# Patient Record
Sex: Male | Born: 1965 | Race: White | Hispanic: No | Smoking: Current every day smoker
Health system: Southern US, Community
[De-identification: ages and names within clinical notes are randomized; demographics above are authoritative.]

## PROBLEM LIST (undated history)

## (undated) DIAGNOSIS — E7800 Pure hypercholesterolemia, unspecified: Secondary | ICD-10-CM

## (undated) DIAGNOSIS — F1910 Other psychoactive substance abuse, uncomplicated: Secondary | ICD-10-CM

## (undated) HISTORY — PX: APPENDECTOMY: SHX54

## (undated) HISTORY — DX: Other psychoactive substance abuse, uncomplicated: F19.10

---

## 2003-05-21 ENCOUNTER — Emergency Department (HOSPITAL_COMMUNITY): Admission: AD | Admit: 2003-05-21 | Discharge: 2003-05-21 | Payer: Self-pay | Admitting: Family Medicine

## 2011-10-25 ENCOUNTER — Emergency Department (HOSPITAL_COMMUNITY): Payer: Self-pay | Admitting: Anesthesiology

## 2011-10-25 ENCOUNTER — Emergency Department (HOSPITAL_BASED_OUTPATIENT_CLINIC_OR_DEPARTMENT_OTHER): Payer: Self-pay

## 2011-10-25 ENCOUNTER — Encounter (HOSPITAL_COMMUNITY): Payer: Self-pay | Admitting: Anesthesiology

## 2011-10-25 ENCOUNTER — Inpatient Hospital Stay (HOSPITAL_BASED_OUTPATIENT_CLINIC_OR_DEPARTMENT_OTHER)
Admission: EM | Admit: 2011-10-25 | Discharge: 2011-10-26 | DRG: 343 | Disposition: A | Discharge status: Home / Self Care | Payer: Self-pay | Attending: General Surgery | Admitting: General Surgery

## 2011-10-25 ENCOUNTER — Encounter (HOSPITAL_COMMUNITY)
Admission: EM | Disposition: A | Discharge status: Home / Self Care | Payer: Self-pay | Source: Home / Self Care | Attending: General Surgery

## 2011-10-25 ENCOUNTER — Encounter (HOSPITAL_BASED_OUTPATIENT_CLINIC_OR_DEPARTMENT_OTHER): Payer: Self-pay | Admitting: *Deleted

## 2011-10-25 DIAGNOSIS — E7800 Pure hypercholesterolemia, unspecified: Secondary | ICD-10-CM | POA: Diagnosis present

## 2011-10-25 DIAGNOSIS — K3580 Unspecified acute appendicitis: Principal | ICD-10-CM | POA: Diagnosis present

## 2011-10-25 DIAGNOSIS — K37 Unspecified appendicitis: Secondary | ICD-10-CM

## 2011-10-25 HISTORY — DX: Pure hypercholesterolemia, unspecified: E78.00

## 2011-10-25 HISTORY — PX: LAPAROSCOPIC APPENDECTOMY: SHX408

## 2011-10-25 LAB — DIFFERENTIAL
Eosinophils Absolute: 0.2 10*3/uL (ref 0.0–0.7)
Eosinophils Relative: 2 % (ref 0–5)
Lymphocytes Relative: 11 % — ABNORMAL LOW (ref 12–46)
Lymphs Abs: 1 10*3/uL (ref 0.7–4.0)
Monocytes Absolute: 1 10*3/uL (ref 0.1–1.0)

## 2011-10-25 LAB — COMPREHENSIVE METABOLIC PANEL
CO2: 26 mEq/L (ref 19–32)
Calcium: 8.9 mg/dL (ref 8.4–10.5)
Creatinine, Ser: 1.1 mg/dL (ref 0.50–1.35)
GFR calc Af Amer: >90 mL/min (ref >90)
GFR calc non Af Amer: 79 mL/min — ABNORMAL LOW (ref >90)
Glucose, Bld: 96 mg/dL (ref 70–99)
Sodium: 137 mEq/L (ref 135–145)
Total Protein: 7.6 g/dL (ref 6.0–8.3)

## 2011-10-25 LAB — CBC
HCT: 40.5 % (ref 39.0–52.0)
MCH: 30.8 pg (ref 26.0–34.0)
MCV: 86.5 fL (ref 78.0–100.0)
Platelets: 137 10*3/uL — ABNORMAL LOW (ref 150–400)
RBC: 4.68 MIL/uL (ref 4.22–5.81)
RDW: 13.5 % (ref 11.5–15.5)
WBC: 9.6 10*3/uL (ref 4.0–10.5)

## 2011-10-25 SURGERY — APPENDECTOMY, LAPAROSCOPIC
Anesthesia: General | Site: Abdomen | Wound class: Contaminated

## 2011-10-25 MED ORDER — LACTATED RINGERS IR SOLN
Status: DC | PRN
  Administered 2011-10-25: 1000 mL

## 2011-10-25 MED ORDER — ACETAMINOPHEN 10 MG/ML IV SOLN
INTRAVENOUS | Status: AC
  Filled 2011-10-25: qty 100

## 2011-10-25 MED ORDER — ONDANSETRON HCL 4 MG/2ML IJ SOLN: 4.0000 mg | Freq: Four times a day (QID) | INTRAMUSCULAR | Status: DC | PRN

## 2011-10-25 MED ORDER — BUPIVACAINE HCL (PF) 0.25 % IJ SOLN
INTRAMUSCULAR | Status: AC
  Filled 2011-10-25: qty 30

## 2011-10-25 MED ORDER — IOHEXOL 300 MG/ML  SOLN
20.0000 mL | INTRAMUSCULAR | Status: AC
  Administered 2011-10-25 (×2): 20 mL via ORAL

## 2011-10-25 MED ORDER — LIDOCAINE-EPINEPHRINE 1 %-1:100000 IJ SOLN
INTRAMUSCULAR | Status: DC | PRN
  Administered 2011-10-25: 10 mL

## 2011-10-25 MED ORDER — PROMETHAZINE HCL 25 MG/ML IJ SOLN: 6.2500 mg | INTRAMUSCULAR | Status: DC | PRN

## 2011-10-25 MED ORDER — LACTATED RINGERS IV SOLN: INTRAVENOUS | Status: DC

## 2011-10-25 MED ORDER — IOHEXOL 300 MG/ML  SOLN
100.0000 mL | Freq: Once | INTRAMUSCULAR | Status: AC | PRN
  Administered 2011-10-25: 100 mL via INTRAVENOUS

## 2011-10-25 MED ORDER — KCL IN DEXTROSE-NACL 20-5-0.45 MEQ/L-%-% IV SOLN
INTRAVENOUS | Status: DC
  Administered 2011-10-25: 21:00:00 via INTRAVENOUS
  Filled 2011-10-25 (×2): qty 1000

## 2011-10-25 MED ORDER — FENTANYL CITRATE 0.05 MG/ML IJ SOLN
INTRAMUSCULAR | Status: DC | PRN
  Administered 2011-10-25 (×2): 50 ug via INTRAVENOUS
  Administered 2011-10-25: 150 ug via INTRAVENOUS
  Administered 2011-10-25: 50 ug via INTRAVENOUS

## 2011-10-25 MED ORDER — LIDOCAINE-EPINEPHRINE 1 %-1:100000 IJ SOLN
INTRAMUSCULAR | Status: AC
  Filled 2011-10-25: qty 1

## 2011-10-25 MED ORDER — SODIUM CHLORIDE 0.9 % IV SOLN
1.0000 g | INTRAVENOUS | Status: AC
  Administered 2011-10-25: 1 g via INTRAVENOUS
  Filled 2011-10-25: qty 1

## 2011-10-25 MED ORDER — HYDROCODONE-ACETAMINOPHEN 5-325 MG PO TABS: 1.0000 | ORAL_TABLET | ORAL | Status: DC | PRN

## 2011-10-25 MED ORDER — LIDOCAINE HCL (CARDIAC) 20 MG/ML IV SOLN
INTRAVENOUS | Status: DC | PRN
  Administered 2011-10-25: 40 mg via INTRAVENOUS

## 2011-10-25 MED ORDER — FENTANYL CITRATE 0.05 MG/ML IJ SOLN: 25.0000 ug | INTRAMUSCULAR | Status: DC | PRN

## 2011-10-25 MED ORDER — ONDANSETRON HCL 4 MG PO TABS: 4.0000 mg | ORAL_TABLET | Freq: Four times a day (QID) | ORAL | Status: DC | PRN

## 2011-10-25 MED ORDER — GLYCOPYRROLATE 0.2 MG/ML IJ SOLN
INTRAMUSCULAR | Status: DC | PRN
  Administered 2011-10-25: .6 mg via INTRAVENOUS

## 2011-10-25 MED ORDER — CISATRACURIUM BESYLATE (PF) 10 MG/5ML IV SOLN
INTRAVENOUS | Status: DC | PRN
  Administered 2011-10-25: 8 mg via INTRAVENOUS
  Administered 2011-10-25: 2 mg via INTRAVENOUS

## 2011-10-25 MED ORDER — KETOROLAC TROMETHAMINE 30 MG/ML IJ SOLN: 15.0000 mg | Freq: Once | INTRAMUSCULAR | Status: DC | PRN

## 2011-10-25 MED ORDER — NEOSTIGMINE METHYLSULFATE 1 MG/ML IJ SOLN
INTRAMUSCULAR | Status: DC | PRN
  Administered 2011-10-25: 4 mg via INTRAVENOUS

## 2011-10-25 MED ORDER — LACTATED RINGERS IV SOLN
INTRAVENOUS | Status: DC | PRN
  Administered 2011-10-25 (×2): via INTRAVENOUS

## 2011-10-25 MED ORDER — PROPOFOL 10 MG/ML IV EMUL
INTRAVENOUS | Status: DC | PRN
  Administered 2011-10-25: 180 mg via INTRAVENOUS
  Administered 2011-10-25: 20 mg via INTRAVENOUS

## 2011-10-25 MED ORDER — DEXTROSE 5 % IV SOLN
1.0000 g | Freq: Four times a day (QID) | INTRAVENOUS | Status: AC
  Administered 2011-10-25 – 2011-10-26 (×3): 1 g via INTRAVENOUS
  Filled 2011-10-25 (×4): qty 1

## 2011-10-25 MED ORDER — FENTANYL CITRATE 0.05 MG/ML IJ SOLN
INTRAMUSCULAR | Status: AC
  Filled 2011-10-25: qty 2

## 2011-10-25 MED ORDER — DEXAMETHASONE SODIUM PHOSPHATE 4 MG/ML IJ SOLN
INTRAMUSCULAR | Status: DC | PRN
  Administered 2011-10-25: 8 mg via INTRAVENOUS

## 2011-10-25 MED ORDER — BUPIVACAINE HCL 0.25 % IJ SOLN
INTRAMUSCULAR | Status: DC | PRN
  Administered 2011-10-25: 10 mL

## 2011-10-25 MED ORDER — SUCCINYLCHOLINE CHLORIDE 20 MG/ML IJ SOLN
INTRAMUSCULAR | Status: DC | PRN
  Administered 2011-10-25: 80 mg via INTRAVENOUS

## 2011-10-25 MED ORDER — 0.9 % SODIUM CHLORIDE (POUR BTL) OPTIME
TOPICAL | Status: DC | PRN
  Administered 2011-10-25: 1000 mL

## 2011-10-25 MED ORDER — ONDANSETRON HCL 4 MG/2ML IJ SOLN
INTRAMUSCULAR | Status: DC | PRN
  Administered 2011-10-25: 4 mg via INTRAVENOUS

## 2011-10-25 MED ORDER — ACETAMINOPHEN 10 MG/ML IV SOLN
INTRAVENOUS | Status: DC | PRN
  Administered 2011-10-25: 1000 mg via INTRAVENOUS

## 2011-10-25 MED ORDER — ENOXAPARIN SODIUM 40 MG/0.4ML ~~LOC~~ SOLN
40.0000 mg | SUBCUTANEOUS | Status: DC
  Filled 2011-10-25: qty 0.4

## 2011-10-25 MED ORDER — MORPHINE SULFATE 2 MG/ML IJ SOLN: 2.0000 mg | INTRAMUSCULAR | Status: DC | PRN

## 2011-10-25 SURGICAL SUPPLY — 41 items
CABLE HIGH FREQUENCY MONO STRZ (ELECTRODE) ×2 IMPLANT
CANISTER SUCTION 2500CC (MISCELLANEOUS) ×2 IMPLANT
CHLORAPREP W/TINT 26ML (MISCELLANEOUS) ×2 IMPLANT
CLOTH BEACON ORANGE TIMEOUT ST (SAFETY) ×2 IMPLANT
COVER SURGICAL LIGHT HANDLE (MISCELLANEOUS) ×2 IMPLANT
DECANTER SPIKE VIAL GLASS SM (MISCELLANEOUS) ×2 IMPLANT
DERMABOND ADVANCED (GAUZE/BANDAGES/DRESSINGS) ×1
DERMABOND ADVANCED .7 DNX12 (GAUZE/BANDAGES/DRESSINGS) ×1 IMPLANT
DRAPE LAPAROSCOPIC ABDOMINAL (DRAPES) ×2 IMPLANT
ELECT CAUTERY BLADE 6.4 (BLADE) ×2 IMPLANT
ELECT REM PT RETURN 9FT ADLT (ELECTROSURGICAL) ×2
ELECTRODE REM PT RTRN 9FT ADLT (ELECTROSURGICAL) ×1 IMPLANT
ENDO GIA UNIVERSAL XLG (ENDOMECHANICALS) ×2 IMPLANT
FILTER SMOKE EVAC LAPAROSHD (FILTER) IMPLANT
GLOVE BIOGEL PI IND STRL 7.0 (GLOVE) ×1 IMPLANT
GLOVE BIOGEL PI INDICATOR 7.0 (GLOVE) ×1
GLOVE SURG SS PI 7.5 STRL IVOR (GLOVE) ×8 IMPLANT
GOWN PREVENTION PLUS LG XLONG (DISPOSABLE) ×2 IMPLANT
GOWN STRL NON-REIN LRG LVL3 (GOWN DISPOSABLE) ×2 IMPLANT
GOWN STRL REIN XL XLG (GOWN DISPOSABLE) ×4 IMPLANT
GRASPER LAPSCPC 5X35 EPIX (ENDOMECHANICALS) IMPLANT
HANDLE STAPLE  ENDO EGIA 4 STD (STAPLE) ×1
HANDLE STAPLE ENDO EGIA 4 STD (STAPLE) ×1 IMPLANT
KIT BASIN OR (CUSTOM PROCEDURE TRAY) ×2 IMPLANT
NS IRRIG 1000ML POUR BTL (IV SOLUTION) ×2 IMPLANT
PENCIL BUTTON HOLSTER BLD 10FT (ELECTRODE) ×2 IMPLANT
POUCH SPECIMEN RETRIEVAL 10MM (ENDOMECHANICALS) ×2 IMPLANT
RELOAD EGIA 45 MED/THCK PURPLE (STAPLE) ×2 IMPLANT
RELOAD EGIA 45 TAN VASC (STAPLE) IMPLANT
SCALPEL HARMONIC ACE (MISCELLANEOUS) IMPLANT
SCISSORS LAP 5X35 DISP (ENDOMECHANICALS) ×2 IMPLANT
SET IRRIG TUBING LAPAROSCOPIC (IRRIGATION / IRRIGATOR) ×2 IMPLANT
SLEEVE Z-THREAD 5X100MM (TROCAR) ×2 IMPLANT
SOLUTION ANTI FOG 6CC (MISCELLANEOUS) ×2 IMPLANT
SUT MNCRL AB 4-0 PS2 18 (SUTURE) ×2 IMPLANT
TOWEL OR 17X26 10 PK STRL BLUE (TOWEL DISPOSABLE) ×2 IMPLANT
TRAY FOLEY CATH 14FRSI W/METER (CATHETERS) ×2 IMPLANT
TRAY LAP CHOLE (CUSTOM PROCEDURE TRAY) ×2 IMPLANT
TROCAR BALLN 12MMX100 BLUNT (TROCAR) ×2 IMPLANT
TROCAR Z-THREAD FIOS 5X100MM (TROCAR) IMPLANT
TUBING INSUFFLATION 10FT LAP (TUBING) ×2 IMPLANT

## 2011-10-25 NOTE — Op Note (Signed)
NAME:  Garrett Knight, Garrett Knight NO.:  [id number]  MEDICAL RECORD NO.:  [id number]  LOCATION:  [facility]                         FACILITY:  [facility]  PHYSICIAN:  Lodema Pilot, MD       DATE OF BIRTH:  1965/12/27  DATE OF PROCEDURE:  10/25/2011 DATE OF DISCHARGE:                              OPERATIVE REPORT   PROCEDURE:  Laparoscopic appendectomy.  PREOPERATIVE DIAGNOSIS:  Acute appendicitis.  POSTOPERATIVE DIAGNOSIS:  Acute appendicitis.  SURGEON:  Dr. Biagio Quint.  ASSISTANTS:  None.  ANESTHESIA:  General endotracheal anesthesia with 20 mL of 1% lidocaine with epinephrine and 0.25% Marcaine in a 50/50 mixture.  FLUIDS:  1200 mL crystalloid.  ESTIMATED BLOOD LOSS:  Minimal.  DRAINS:  None.  SPECIMENS:  Appendix sent to Pathology for permanent section.  COMPLICATIONS:  None apparent.  FINDINGS:  Acute suppurative appendicitis.  INDICATIONS FOR PROCEDURE:  Garrett Knight is a 46 year old male with three day history of right lower quadrant pain, anorexia and nausea, and a CT scan concerning for acute appendicitis.  PROCEDURE IN DETAIL:  Garrett Knight was seen and evaluated in the emergency room, and the risks and benefits of the procedure were discussed in lay terms, and an  informed consent was obtained.  I discussed with him the risks of infection, bleeding, pain, scarring, persistent symptoms, negative laparoscopy, abscess formation and the need for repeat surgery.  He expressed understanding and an informed consent was obtained.  He was given therapeutic antibiotics and taken to the operating room, and placed on the table in the supine position. General endotracheal anesthesia was obtained.  His abdomen was prepped and draped in the standard surgical fashion, and a semicircular infraumbilical incision was made in the skin and dissection carried down to the abdominal wall fascia.  Using blunt dissection, the fascia was elevated and sharply incised, and the peritoneum  entered bluntly. Pneumoperitoneum was obtained and laparoscope was introduced.  A 5 mm left lower quadrant trocar was placed under direct visualization and a 5 mm upper midline trocar was placed, also under direct visualization.  He had an edematous mesentery of the terminal ileum and some fibrinous exudate in the right lower quadrant, along the pelvic sidewall near the iliac vessels.  The small bowel was adherent to this area and covering up the inflammatory process.  The small bowel was retracted away from the area and initially the appendix was not easily visualized, but I followed the tinea past the cecum and was able to elevate the appendix, which was diving down posterior.  The appendix was very thickened and necrotic-appearing distal appendix.  I dissected out the base of the appendix and divided the mesoappendix with the Harmonic scalpel.  I skeletonized the appendix at its base, which appeared to be healthy for stapling and after I had skeletonized this, and isolated it at its base, I transected this with the Endo-GIA purple Tri-Staple load.  The appendix was removed in an EndoCatch bag in two pieces and was inspected on the back table to ensure that I had obtained the entire appendix. The staples appeared well-formed and the right lower quadrant was suctioned from any of the bowel fluid and a small amount of irrigation was  used locally in the right lower quadrant.  Of note, he also appeared to have a right inguinal region, but no evidence of bowel contents.  The specimen was removed from the umbilical trocar site, then the left lower quadrant trocar was removed under direct visualization.  The abdominal wall was noted be hemostatic.  The right lower quadrant appeared hemostatic.  Then the umbilical fascia was approximated with interrupted 0 Vicryl sutures in an open fashion.  The sutures were secured and the wounds were injected with 20 mL of 1% lidocaine with epinephrine,  and the abdomen was re-insufflated through the upper midline port and the abdominal wall closure was noted be adequate.  The right lower quadrant appeared hemostatic and the trocar was removed, and the incisions were closed with 4-0 Monocryl subcuticular suture.  The skin was washed and dried, and Dermabond was applied.  All sponge and instrument counts were correct at the end of the case.  The patient tolerated the procedure well, without apparent complications.          ______________________________ Lodema Pilot, MD     BL/MEDQ  D:  10/25/2011  T:  10/25/2011  Job:  [id number]

## 2011-10-25 NOTE — ED Notes (Signed)
Patient states his abd atarted hurting Friday after he hurts his back at work, no diarrhea, no nausea, just constant mid abd, severe pain, ache/stabbing, has to lean over to walk. Took ibuprofen last night some relief

## 2011-10-25 NOTE — Anesthesia Postprocedure Evaluation (Signed)
  Anesthesia Post-op Note  Patient: Garrett Knight  Procedure(s) Performed: Procedure(s) (LRB): APPENDECTOMY LAPAROSCOPIC (N/A)  Patient Location: PACU  Anesthesia Type: General  Level of Consciousness: awake and alert   Airway and Oxygen Therapy: Patient Spontanous Breathing  Post-op Pain: mild  Post-op Assessment: Post-op Vital signs reviewed, Patient's Cardiovascular Status Stable, Respiratory Function Stable, Patent Airway and No signs of Nausea or vomiting  Post-op Vital Signs: stable  Complications: No apparent anesthesia complications

## 2011-10-25 NOTE — Progress Notes (Signed)
Arrived to floor from pacu. Alert and oriented no c/o pain. Incisions x3 cd&i.

## 2011-10-25 NOTE — ED Notes (Signed)
MD at bedside. 

## 2011-10-25 NOTE — ED Notes (Signed)
WUJ:[id number]<JY> Expected date:<BR> Expected time:<BR> Means of arrival:<BR> Comments:<BR> Trans [facility] Appendicitis

## 2011-10-25 NOTE — ED Provider Notes (Signed)
History     CSN: [id number]  Arrival date & time 10/25/11  1027   First MD Initiated Contact with Patient 10/25/11 1107      Chief Complaint  Patient presents with  . Abdominal Pain    (Consider location/radiation/quality/duration/timing/severity/associated sxs/prior treatment) Patient is a 46 y.o. male presenting with abdominal pain. The history is provided by the patient.  Abdominal Pain The primary symptoms of the illness include abdominal pain. The primary symptoms of the illness do not include dysuria. Episode onset: 3 days ago. The onset of the illness was sudden. The problem has not changed since onset. The abdominal pain is located in the periumbilical region. The abdominal pain does not radiate. The severity of the abdominal pain is 9/10. The abdominal pain is relieved by nothing. The abdominal pain is exacerbated by movement (Standing and walking).  Associated with: Started after hurting his back at work. The patient has not had a change in bowel habit. Additional symptoms associated with the illness include back pain. Symptoms associated with the illness do not include chills, anorexia, diaphoresis, constipation, urgency or frequency. Significant associated medical issues do not include GERD, gallstones or liver disease.    Past Medical History  Diagnosis Date  . High cholesterol     History reviewed. No pertinent past surgical history.  No family history on file.  History  Substance Use Topics  . Smoking status: Current Everyday Smoker  . Smokeless tobacco: Not on file  . Alcohol Use: No      Review of Systems  Constitutional: Negative for chills and diaphoresis.  Gastrointestinal: Positive for abdominal pain. Negative for constipation and anorexia.  Genitourinary: Negative for dysuria, urgency, frequency and flank pain.  Musculoskeletal: Positive for back pain.       States he has intermittant low back problems all the time with his work.  Last started  hurting on Thursday of this week.  All other systems reviewed and are negative.    Allergies  Review of patient's allergies indicates no known allergies.  Home Medications  No current outpatient prescriptions on file.  BP 101/61  Pulse 76  Temp(Src) 98 F (36.7 C) (Oral)  Resp 20  SpO2 99%  Physical Exam  Nursing note and vitals reviewed. Constitutional: He is oriented to person, place, and time. He appears well-developed and well-nourished. No distress.  HENT:  Head: Normocephalic and atraumatic.  Mouth/Throat: Oropharynx is clear and moist.  Eyes: Conjunctivae and EOM are normal. Pupils are equal, round, and reactive to light.  Neck: Normal range of motion. Neck supple.  Cardiovascular: Normal rate, regular rhythm and intact distal pulses.   No murmur heard. Pulmonary/Chest: Effort normal and breath sounds normal. No respiratory distress. He has no wheezes. He has no rales.  Abdominal: Soft. Normal appearance. He exhibits no distension and no pulsatile midline mass. There is tenderness in the right lower quadrant and periumbilical area. There is guarding. There is no rebound and no CVA tenderness. No hernia.  Musculoskeletal: Normal range of motion. He exhibits no edema and no tenderness.  Neurological: He is alert and oriented to person, place, and time.  Skin: Skin is warm and dry. No rash noted. No erythema.  Psychiatric: He has a normal mood and affect. His behavior is normal.    ED Course  Procedures (including critical care time)  Labs Reviewed  CBC - Abnormal; Notable for the following:    Platelets 137 (*)    All other components within normal limits  DIFFERENTIAL -  Abnormal; Notable for the following:    Lymphocytes Relative 11 (*)    All other components within normal limits  COMPREHENSIVE METABOLIC PANEL - Abnormal; Notable for the following:    BUN 28 (*)    Albumin 3.3 (*)    GFR calc non Af Amer 79 (*)    All other components within normal limits    Ct Abdomen Pelvis W Contrast  10/25/2011  *RADIOLOGY REPORT*  Clinical Data: Abdominal pain around the umbilicus.  Heavy lifting on Thursday.  Back pain.  No nausea or vomiting.  CT ABDOMEN AND PELVIS WITH CONTRAST  Technique:  Multidetector CT imaging of the abdomen and pelvis was performed following the standard protocol during bolus administration of intravenous contrast.  Contrast: OMNIPAQUE IOHEXOL 300 MG/ML  SOLN  Comparison: None.  Findings:  The lung bases are unremarkable.  Heart size is normal. Small low attenuation lesions are identified within the liver, some of which show enhanced pattern on the delayed images. Statistically, these likely represent small hemangiomas.  However, these are not fully characterized on this exam.  No focal abnormality identified within the spleen, pancreas, adrenal glands, or kidneys.  The gallbladder is present.  Within the right lower quadrant, there is inflammatory change surrounding the appendix and mesentery of the lower ileum.  The appendix is 13 mm in diameter and the inflammatory changes appears centered on the appendix.  There are inflammatory changes and enhancement of the appendiceal wall and terminal ileal wall. Findings are favored to be acute appendicitis secondarily involving the distal ileal loops.  Alternatively, the findings could be related to current.  No evidence for abscess, free intraperitoneal air.  The colon has a normal appearance.  Stomach is normal in appearance.  No evidence for aortic aneurysm.  There is atherosclerotic calcification of the aorta. There are mildly prominent lymph nodes within the external iliac chains bilaterally, on the right measuring 2 cm and on the left, measuring 2.4 cm.  Visualized osseous structures have a normal appearance.  IMPRESSION:  1.  Significant inflammatory changes in the right lower quadrant, favored to be related to acute appendicitis. 2.  Inflammation, wall thickening, and because of enhancement of  the right lower quadrant ileal loops, favored to be a secondary process related to appendicitis.  Crohn's disease would be a consideration as well. 3.  No evidence for abscess or free intraperitoneal air. 4.  Multiple small liver lesions, most likely benign.  The findings were discussed with Dr. Anitra Lauth on 10/25/2011 at 1:05 p.m.  Original Report Authenticated By: Patterson Hammersmith, M.D.     No diagnosis found.    MDM   Patient with abdominal pain has been ongoing for the last 3 days. It is worse with movement, standing or walking. He states it occurred after hurting his lower back at work. He denies any nausea, vomiting, change in bowel movements. Pain is worse with eating. He denies any numbness or tingling in his lower extremities and no shortness of breath or chest pain. Epigastric abdominal pain over the umbilicus. There are no palpable hernias. Patient states this has occurred once or twice before in the last 5-6 years. But states usually the pain goes away after a few days. Concern for possible AAA versus abdominal pathology versus hernia.   CBC, CMP are within normal limits. CT of the abdomen and pelvis pending.  1:12 PM CT showed acute appendicitis. Spoke with surgery for admission and removal        [employer],  MD 10/25/11 1312

## 2011-10-25 NOTE — H&P (Signed)
Reason for Consult: appendicitis Referring Physician: [name] is an 46 y.o. male.  HPI: this patient was referred by Dr. Anitra Knight at [facility] for evaluation of three-day history of periumbilical and right lower quadrant pain. He says that he had a similar episode approximately one month ago but this spontaneously resolved after 3 days. He had not had any recurrent episodes until approximately 3 days ago he began feeling nauseated but had no associated vomiting. He went to work and continue to feel bad with some generalized periumbilical pain. He has been anorexic and had associated chills. Now he says that he has just continual right lower quadrant pain which he does describes as severe and constant. He is not measured any fevers but has had associated chills. He denies any dysuria, hematuria, back pain, melena or bloody stools, diarrhea.  Past Medical History  Diagnosis Date  . High cholesterol     History reviewed. No pertinent past surgical history.  No family history on file.  Social History:  reports that he has been smoking.  He does not have any smokeless tobacco history on file. He reports that he does not drink alcohol or use illicit drugs.  Allergies: No Known Allergies  Medications: I have reviewed the patient's current medications.  Results for orders placed during the hospital encounter of 10/25/11 (from the past 48 hour(s))  CBC     Status: Abnormal   Collection Time   10/25/11 11:29 AM      Component Value Range Comment   WBC 9.6  4.0 - 10.5 (K/uL)    RBC 4.68  4.22 - 5.81 (MIL/uL)    Hemoglobin 14.4  13.0 - 17.0 (g/dL)    HCT 16.1  09.6 - 04.5 (%)    MCV 86.5  78.0 - 100.0 (fL)    MCH 30.8  26.0 - 34.0 (pg)    MCHC 35.6  30.0 - 36.0 (g/dL)    RDW 40.9  81.1 - 91.4 (%)    Platelets 137 (*) 150 - 400 (K/uL)   DIFFERENTIAL     Status: Abnormal   Collection Time   10/25/11 11:29 AM      Component Value Range Comment   Neutrophils Relative 76  43 - 77  (%)    Neutro Abs 7.3  1.7 - 7.7 (K/uL)    Lymphocytes Relative 11 (*) 12 - 46 (%)    Lymphs Abs 1.0  0.7 - 4.0 (K/uL)    Monocytes Relative 10  3 - 12 (%)    Monocytes Absolute 1.0  0.1 - 1.0 (K/uL)    Eosinophils Relative 2  0 - 5 (%)    Eosinophils Absolute 0.2  0.0 - 0.7 (K/uL)    Basophils Relative 0  0 - 1 (%)    Basophils Absolute 0.0  0.0 - 0.1 (K/uL)   COMPREHENSIVE METABOLIC PANEL     Status: Abnormal   Collection Time   10/25/11 11:29 AM      Component Value Range Comment   Sodium 137  135 - 145 (mEq/L)    Potassium 3.7  3.5 - 5.1 (mEq/L)    Chloride 103  96 - 112 (mEq/L)    CO2 26  19 - 32 (mEq/L)    Glucose, Bld 96  70 - 99 (mg/dL)    BUN 28 (*) 6 - 23 (mg/dL)    Creatinine, Ser 7.82  0.50 - 1.35 (mg/dL)    Calcium 8.9  8.4 - 10.5 (mg/dL)    Total  Protein 7.6  6.0 - 8.3 (g/dL)    Albumin 3.3 (*) 3.5 - 5.2 (g/dL)    AST 16  0 - 37 (U/L)    ALT 17  0 - 53 (U/L)    Alkaline Phosphatase 64  39 - 117 (U/L)    Total Bilirubin 0.6  0.3 - 1.2 (mg/dL)    GFR calc non Af Amer 79 (*) >90 (mL/min)    GFR calc Af Amer >90  >90 (mL/min)     Ct Abdomen Pelvis W Contrast  10/25/2011  *RADIOLOGY REPORT*  Clinical Data: Abdominal pain around the umbilicus.  Heavy lifting on Thursday.  Back pain.  No nausea or vomiting.  CT ABDOMEN AND PELVIS WITH CONTRAST  Technique:  Multidetector CT imaging of the abdomen and pelvis was performed following the standard protocol during bolus administration of intravenous contrast.  Contrast: OMNIPAQUE IOHEXOL 300 MG/ML  SOLN  Comparison: None.  Findings:  The lung bases are unremarkable.  Heart size is normal. Small low attenuation lesions are identified within the liver, some of which show enhanced pattern on the delayed images. Statistically, these likely represent small hemangiomas.  However, these are not fully characterized on this exam.  No focal abnormality identified within the spleen, pancreas, adrenal glands, or kidneys.  The gallbladder  is present.  Within the right lower quadrant, there is inflammatory change surrounding the appendix and mesentery of the lower ileum.  The appendix is 13 mm in diameter and the inflammatory changes appears centered on the appendix.  There are inflammatory changes and enhancement of the appendiceal wall and terminal ileal wall. Findings are favored to be acute appendicitis secondarily involving the distal ileal loops.  Alternatively, the findings could be related to current.  No evidence for abscess, free intraperitoneal air.  The colon has a normal appearance.  Stomach is normal in appearance.  No evidence for aortic aneurysm.  There is atherosclerotic calcification of the aorta. There are mildly prominent lymph nodes within the external iliac chains bilaterally, on the right measuring 2 cm and on the left, measuring 2.4 cm.  Visualized osseous structures have a normal appearance.  IMPRESSION:  1.  Significant inflammatory changes in the right lower quadrant, favored to be related to acute appendicitis. 2.  Inflammation, wall thickening, and because of enhancement of the right lower quadrant ileal loops, favored to be a secondary process related to appendicitis.  Crohn's disease would be a consideration as well. 3.  No evidence for abscess or free intraperitoneal air. 4.  Multiple small liver lesions, most likely benign.  The findings were discussed with Dr. Anitra Knight on 10/25/2011 at 1:05 p.m.  Original Report Authenticated By: Patterson Hammersmith, M.D.   All other review of systems negative or noncontributory except as stated in the HPI  Blood pressure 104/54, pulse 78, temperature 100.3 F (37.9 C), temperature source Oral, resp. rate 20, SpO2 96.00%. General appearance: alert, cooperative and no distress Head: Normocephalic, without obvious abnormality, atraumatic Resp: clear to auscultation bilaterally Cardio: normal rate, regular rhythm GI: lower abdominal pain which is worst in RLQ, ND, no peritoneal  signs, no mass Extremities: extremities normal, atraumatic, no cyanosis or edema Skin: Skin color, texture, turgor normal. No rashes or lesions Neurologic: Grossly normal  Assessment/Plan: Abdominal pain which is most likely consistent with acute appendicitis.  I think that his symptoms are most likely due to appendicitis. Even though he says that he had a similar episode approximately one month ago given the fact that he  has had continual and increasing right lower quadrant abdominal pain with associated anorexia and chills as well as a CT scan which is concerning for right lower quadrant inflammatory changes, I think that this is most likely appendicitis. He does not have a history consistent with Crohn's disease or inflammatory bowel disease though I did discuss this possibility with the patient. We discussed the options for continued observation versus surgery and the risks and benefits of each option. He would like to proceed with diagnostic laparoscopy and appendectomy. We discussed the risks of the procedure including, infection, bleeding, pain, scarring, persistent symptoms and negative laparoscopy, need for open surgery, abscess and he expressed understanding and desires to proceed with arthroscopic appendectomy possible open.  Garrett Knight 10/25/2011, 4:44 PM

## 2011-10-25 NOTE — Brief Op Note (Signed)
10/25/2011  6:53 PM  PATIENT:  Garrett Knight  46 y.o. male  PRE-OPERATIVE DIAGNOSIS:  appendicitis  POST-OPERATIVE DIAGNOSIS:  appendicitis  PROCEDURE:  Procedure(s) (LRB): APPENDECTOMY LAPAROSCOPIC (N/A)  SURGEON:  Surgeon(s) and Role:    * Lodema Pilot, DO - Primary  PHYSICIAN ASSISTANT:   ASSISTANTS: none   ANESTHESIA:   general  EBL:  Total I/O In: 1000 [I.V.:1000] Out: 120 [Urine:120]  BLOOD ADMINISTERED:none  DRAINS: none   LOCAL MEDICATIONS USED:  MARCAINE    and LIDOCAINE   SPECIMEN:  Source of Specimen:  appendix  DISPOSITION OF SPECIMEN:  PATHOLOGY  COUNTS:  YES  TOURNIQUET:  * No tourniquets in log *  DICTATION: .Other Dictation: Dictation Number [id number]  PLAN OF CARE: Admit for overnight observation  PATIENT DISPOSITION:  PACU - hemodynamically stable.   Delay start of Pharmacological VTE agent (>24hrs) due to surgical blood loss or risk of bleeding: no

## 2011-10-25 NOTE — Anesthesia Preprocedure Evaluation (Signed)
Anesthesia Evaluation  Patient identified by MRN, date of birth, ID band Patient awake    Reviewed: Allergy & Precautions, H&P , NPO status , Patient's Chart, lab work & pertinent test results  Airway Mallampati: II TM Distance: >3 FB Neck ROM: Full    Dental No notable dental hx.    Pulmonary Current Smoker,  breath sounds clear to auscultation  Pulmonary exam normal       Cardiovascular negative cardio ROS  Rhythm:Regular Rate:Normal     Neuro/Psych negative neurological ROS  negative psych ROS   GI/Hepatic negative GI ROS, Neg liver ROS,   Endo/Other  negative endocrine ROS  Renal/GU negative Renal ROS  negative genitourinary   Musculoskeletal negative musculoskeletal ROS (+)   Abdominal   Peds negative pediatric ROS (+)  Hematology negative hematology ROS (+)   Anesthesia Other Findings   Reproductive/Obstetrics negative OB ROS                           Anesthesia Physical Anesthesia Plan  ASA: II and Emergent  Anesthesia Plan: General   Post-op Pain Management:    Induction: Intravenous and Rapid sequence  Airway Management Planned: Oral ETT  Additional Equipment:   Intra-op Plan:   Post-operative Plan: Extubation in OR  Informed Consent: I have reviewed the patients History and Physical, chart, labs and discussed the procedure including the risks, benefits and alternatives for the proposed anesthesia with the patient or authorized representative who has indicated his/her understanding and acceptance.   Dental advisory given  Plan Discussed with: CRNA  Anesthesia Plan Comments:        Anesthesia Quick Evaluation  

## 2011-10-25 NOTE — Preoperative (Addendum)
Beta Blockers   Reason not to administer Beta Blockers:Not Applicable 

## 2011-10-25 NOTE — Transfer of Care (Signed)
Immediate Anesthesia Transfer of Care Note  Patient: Garrett Knight  Procedure(s) Performed: Procedure(s) (LRB): APPENDECTOMY LAPAROSCOPIC (N/A)  Patient Location: PACU  Anesthesia Type: General  Level of Consciousness: awake, alert , oriented and patient cooperative  Airway & Oxygen Therapy: Patient Spontanous Breathing and Patient connected to face mask oxygen  Post-op Assessment: Report given to PACU RN and Post -op Vital signs reviewed and stable  Post vital signs: Reviewed and stable  Complications: No apparent anesthesia complications

## 2011-10-25 NOTE — Anesthesia Procedure Notes (Signed)
Procedure Name: Intubation Date/Time: 10/25/2011 5:36 PM Performed by: Leroy Libman L Patient Re-evaluated:Patient Re-evaluated prior to inductionOxygen Delivery Method: Circle system utilized Preoxygenation: Pre-oxygenation with 100% oxygen Intubation Type: IV induction, Rapid sequence and Cricoid Pressure applied Grade View: Grade II Tube type: Oral Tube size: 8.0 mm Number of attempts: 1 Airway Equipment and Method: Stylet Placement Confirmation: ETT inserted through vocal cords under direct vision,  breath sounds checked- equal and bilateral and positive ETCO2 Secured at: 22 cm Tube secured with: Tape Dental Injury: Teeth and Oropharynx as per pre-operative assessment

## 2011-10-25 NOTE — ED Notes (Signed)
IV covered with dressing, site wnl, instructions given to patient, IV left in place per MD request

## 2011-10-26 ENCOUNTER — Encounter (HOSPITAL_COMMUNITY): Payer: Self-pay | Admitting: *Deleted

## 2011-10-26 MED ORDER — HYDROCODONE-ACETAMINOPHEN 5-325 MG PO TABS: 1.0000 | ORAL_TABLET | ORAL | Status: AC | PRN

## 2011-10-26 NOTE — Discharge Summary (Signed)
  Physician Discharge Summary  Patient ID: Garrett Knight MRN: [id number] DOB/AGE: [birth year] 46 y.o.  Admit date: 10/25/2011 Discharge date: 10/26/2011  Admission Diagnoses: appendicitis  Discharge Diagnoses: same Active Problems:  * No active hospital problems. *    Discharged Condition: stable  Hospital Course: to or 07/29/11 for lap appendectomy.  Did well overnight and pain much improved from preop pain.  Tolerating regular diet, ambulatory and stable for discharge on POD 1  Consults: None  Significant Diagnostic Studies: CT  Treatments: surgery: lap appendectomy 10/26/11  Disposition: Final discharge disposition not confirmed   Medication List  As of 10/26/2011  9:26 AM   ASK your doctor about these medications         sulfamethoxazole-trimethoprim 400-80 MG per tablet   Commonly known as: BACTRIM,SEPTRA   Take 1 tablet by mouth daily.             SignedLodema Pilot DAVID 10/26/2011, 9:26 AM

## 2011-10-26 NOTE — Progress Notes (Signed)
D/c instructions reviewed, prescription given, pt d/c'd, wheelchair assistance offered, pt refused and insisted on walking out to car.  Family with pt upon d/c.

## 2011-10-27 NOTE — Progress Notes (Signed)
UR complete 

## 2011-10-28 ENCOUNTER — Encounter (HOSPITAL_COMMUNITY): Payer: Self-pay | Admitting: General Surgery

## 2011-11-05 ENCOUNTER — Telehealth (INDEPENDENT_AMBULATORY_CARE_PROVIDER_SITE_OTHER): Payer: Self-pay

## 2011-11-05 NOTE — Telephone Encounter (Signed)
Left voice message for patient follow up appointment date & time w/Dr. Biagio Quint on 11/26/11 @ 2:15pm

## 2011-11-26 ENCOUNTER — Encounter (INDEPENDENT_AMBULATORY_CARE_PROVIDER_SITE_OTHER): Payer: Self-pay | Admitting: General Surgery

## 2011-12-02 ENCOUNTER — Encounter (INDEPENDENT_AMBULATORY_CARE_PROVIDER_SITE_OTHER): Payer: Self-pay | Admitting: General Surgery

## 2013-11-14 ENCOUNTER — Ambulatory Visit (INDEPENDENT_AMBULATORY_CARE_PROVIDER_SITE_OTHER): Payer: Self-pay

## 2013-11-14 DIAGNOSIS — Z113 Encounter for screening for infections with a predominantly sexual mode of transmission: Secondary | ICD-10-CM

## 2013-11-14 DIAGNOSIS — Z79899 Other long term (current) drug therapy: Secondary | ICD-10-CM

## 2013-11-14 DIAGNOSIS — B20 Human immunodeficiency virus [HIV] disease: Secondary | ICD-10-CM

## 2013-11-14 DIAGNOSIS — Z23 Encounter for immunization: Secondary | ICD-10-CM

## 2013-11-14 LAB — COMPLETE METABOLIC PANEL WITH GFR
ALBUMIN: 3.1 g/dL — AB (ref 3.5–5.2)
ALK PHOS: 68 U/L (ref 39–117)
ALT: 36 U/L (ref 0–53)
AST: 43 U/L — AB (ref 0–37)
BUN: 18 mg/dL (ref 6–23)
CALCIUM: 8.7 mg/dL (ref 8.4–10.5)
CHLORIDE: 98 meq/L (ref 96–112)
CO2: 28 mEq/L (ref 19–32)
Creat: 0.96 mg/dL (ref 0.50–1.35)
GFR, Est African American: >89 mL/min
GFR, Est Non African American: >89 mL/min
Glucose, Bld: 98 mg/dL (ref 70–99)
POTASSIUM: 3.7 meq/L (ref 3.5–5.3)
SODIUM: 132 meq/L — AB (ref 135–145)
TOTAL PROTEIN: 7.8 g/dL (ref 6.0–8.3)
Total Bilirubin: 0.5 mg/dL (ref 0.2–1.2)

## 2013-11-14 LAB — LIPID PANEL
CHOL/HDL RATIO: 9.3 ratio
CHOLESTEROL: 121 mg/dL (ref 0–200)
HDL: 13 mg/dL — ABNORMAL LOW (ref >39)
LDL Cholesterol: 56 mg/dL (ref 0–99)
Triglycerides: 258 mg/dL — ABNORMAL HIGH (ref <150)
VLDL: 52 mg/dL — ABNORMAL HIGH (ref 0–40)

## 2013-11-15 LAB — RPR

## 2013-11-15 LAB — CBC WITH DIFFERENTIAL/PLATELET
BASOS ABS: 0.1 10*3/uL (ref 0.0–0.1)
Basophils Relative: 2 % — ABNORMAL HIGH (ref 0–1)
EOS ABS: 0 10*3/uL (ref 0.0–0.7)
EOS PCT: 0 % (ref 0–5)
HCT: 39.5 % (ref 39.0–52.0)
Hemoglobin: 13.4 g/dL (ref 13.0–17.0)
LYMPHS ABS: 3.7 10*3/uL (ref 0.7–4.0)
Lymphocytes Relative: 53 % — ABNORMAL HIGH (ref 12–46)
MCH: 28.2 pg (ref 26.0–34.0)
MCHC: 33.9 g/dL (ref 30.0–36.0)
MCV: 83 fL (ref 78.0–100.0)
Monocytes Absolute: 0.7 10*3/uL (ref 0.1–1.0)
Monocytes Relative: 10 % (ref 3–12)
Neutro Abs: 2.5 10*3/uL (ref 1.7–7.7)
Neutrophils Relative %: 35 % — ABNORMAL LOW (ref 43–77)
PLATELETS: 102 10*3/uL — AB (ref 150–400)
RBC: 4.76 MIL/uL (ref 4.22–5.81)
RDW: 15.1 % (ref 11.5–15.5)
WBC: 7 10*3/uL (ref 4.0–10.5)

## 2013-11-15 LAB — URINALYSIS
Bilirubin Urine: NEGATIVE
GLUCOSE, UA: NEGATIVE mg/dL
Hgb urine dipstick: NEGATIVE
Ketones, ur: NEGATIVE mg/dL
Leukocytes, UA: NEGATIVE
Nitrite: NEGATIVE
PH: 6 (ref 5.0–8.0)
Protein, ur: NEGATIVE mg/dL
Specific Gravity, Urine: 1.018 (ref 1.005–1.030)
Urobilinogen, UA: 1 mg/dL (ref 0.0–1.0)

## 2013-11-15 LAB — HEPATITIS C ANTIBODY: HCV AB: NEGATIVE

## 2013-11-15 LAB — HEPATITIS B SURFACE ANTIBODY,QUALITATIVE: Hep B S Ab: NEGATIVE

## 2013-11-15 LAB — HEPATITIS B SURFACE ANTIGEN: Hepatitis B Surface Ag: NEGATIVE

## 2013-11-15 LAB — HEPATITIS A ANTIBODY, TOTAL: Hep A Total Ab: BORDERLINE — AB

## 2013-11-15 LAB — HEPATITIS B CORE ANTIBODY, TOTAL: Hep B Core Total Ab: NONREACTIVE

## 2013-11-16 LAB — T-HELPER CELL (CD4) - (RCID CLINIC ONLY)
CD4 T CELL HELPER: 13 % — AB (ref 33–55)
CD4 T Cell Abs: 480 /uL (ref 400–2700)

## 2013-11-17 LAB — QUANTIFERON TB GOLD ASSAY (BLOOD)
INTERFERON GAMMA RELEASE ASSAY: NEGATIVE
Mitogen value: 8.94 IU/mL
QUANTIFERON NIL VALUE: 0.47 [IU]/mL
Quantiferon Tb Ag Minus Nil Value: 0 IU/mL
TB AG VALUE: 0.47 [IU]/mL

## 2013-11-17 NOTE — Progress Notes (Signed)
Patient is here after having several months of rashes, night sweats, unintentional 10 lb weight loss,decreased appetite and increased fatigue. He suspected he may be HIV positive for a few years but was not ready to deal with possible positive results. He works in [employer] and in the the last 2 weeks he has been so fatigued he could not do his job which has forced him to seek help.   He thinks he may have tested negative for HIV in 2000. However  I do not have any documentation of testing.  MSM risk factor.   No tattoos or piercings. No medical records Vaccines updated.  Laurell Josephsammy K King, RN

## 2013-11-18 LAB — HIV-1 RNA ULTRAQUANT REFLEX TO GENTYP+
HIV 1 RNA Quant: 35182 copies/mL — ABNORMAL HIGH (ref <20)
HIV-1 RNA QUANT, LOG: 4.55 {Log} — AB (ref <1.30)

## 2013-11-20 LAB — HLA B*5701: HLA-B*5701 w/rflx HLA-B High: NEGATIVE

## 2013-11-21 DIAGNOSIS — B20 Human immunodeficiency virus [HIV] disease: Secondary | ICD-10-CM | POA: Insufficient documentation

## 2013-11-21 NOTE — Progress Notes (Signed)
Patient ID: Garrett Knight, male   DOB: 1965-12-18, 48 y.o.   MRN: [id number] [facility] CM Neila Gearasey Norton

## 2013-11-28 ENCOUNTER — Ambulatory Visit (INDEPENDENT_AMBULATORY_CARE_PROVIDER_SITE_OTHER): Payer: Self-pay | Admitting: Internal Medicine

## 2013-11-28 ENCOUNTER — Encounter: Payer: Self-pay | Admitting: Internal Medicine

## 2013-11-28 VITALS — BP 107/71 | HR 80 | Temp 98.1°F | Ht 70.0 in | Wt 165.2 lb

## 2013-11-28 DIAGNOSIS — A630 Anogenital (venereal) warts: Secondary | ICD-10-CM

## 2013-11-28 DIAGNOSIS — Z23 Encounter for immunization: Secondary | ICD-10-CM

## 2013-11-28 DIAGNOSIS — E785 Hyperlipidemia, unspecified: Secondary | ICD-10-CM | POA: Insufficient documentation

## 2013-11-28 DIAGNOSIS — E781 Pure hyperglyceridemia: Secondary | ICD-10-CM

## 2013-11-28 DIAGNOSIS — F17200 Nicotine dependence, unspecified, uncomplicated: Secondary | ICD-10-CM

## 2013-11-28 DIAGNOSIS — F17210 Nicotine dependence, cigarettes, uncomplicated: Secondary | ICD-10-CM | POA: Insufficient documentation

## 2013-11-28 NOTE — Progress Notes (Signed)
Patient ID: Garrett Knight, male   DOB: [birth year], 48 y.o.   MRN: [id number]          Patient Active Problem List   Diagnosis Date Noted  . Human immunodeficiency virus (HIV) disease 11/21/2013    Patient's Medications  New Prescriptions   No medications on file  Previous Medications   SULFAMETHOXAZOLE-TRIMETHOPRIM (BACTRIM,SEPTRA) 400-80 MG PER TABLET    Take 1 tablet by mouth daily.  Modified Medications   No medications on file  Discontinued Medications   No medications on file    Subjective: Garrett Knight was in for his first visit. He recently went to [facility] for HIV testing and found out his test was positive. He recalls being tested negative in 2000. He had been concerned about possible infection after developing a rash on his calf recently in feeling excessively fatigued at work. He is an exclusively gay male. He is to share the information of his test with anyone since his diagnosis. He has not been sexually active recently.  He says that he is a child of the 6770s. 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 He continues to smoke cigarettes. He says that he has COPD. Is not on any medication currently.  Review of Systems: Constitutional: positive for malaise, negative for anorexia, chills, fevers, sweats and weight loss Eyes: negative Ears, nose, mouth, throat, and face: positive for dental pain Respiratory: negative Cardiovascular: negative Gastrointestinal: negative Genitourinary:negative  Past Medical History  Diagnosis Date  . High cholesterol   . Substance abuse     History  Substance Use Topics  . Smoking status: Current Every Day Smoker -- 1.50 packs/day for 41 years    Types: Cigarettes  . Smokeless tobacco: Not on file  . Alcohol Use: No     Comment: Past history of Cocaine, Marijuana and LSD use     Family History  Problem Relation Age of Onset  . Cancer Sister   .  Diabetes Father   . Heart disease Father     No Known Allergies  Objective: Temp: 98.1 F (36.7 C) (06/30 0921) Temp src: Oral (06/30 0921) BP: 107/71 mmHg (06/30 0921) Pulse Rate: 80 (06/30 0921) Body mass index is 23.71 kg/(m^2).  General: He is anxious but in no distress otherwise Oral: No oropharyngeal lesions. Some missing teeth Skin: No rash Lungs: Clear Cor: Regular S1 and S2 with no murmurs Mood and affect: Appropriate  Lab Results Lab Results  Component Value Date   WBC 7.0 11/14/2013   HGB 13.4 11/14/2013   HCT 39.5 11/14/2013   MCV 83.0 11/14/2013   PLT 102* 11/14/2013    Lab Results  Component Value Date   CREATININE 0.96 11/14/2013   BUN 18 11/14/2013   NA 132* 11/14/2013   K 3.7 11/14/2013   CL 98 11/14/2013   CO2 28 11/14/2013    Lab Results  Component Value Date   ALT 36 11/14/2013   AST 43* 11/14/2013   ALKPHOS 68 11/14/2013   BILITOT 0.5 11/14/2013    Lab Results  Component Value Date   CHOL 121 11/14/2013   HDL 13* 11/14/2013   LDLCALC 56 11/14/2013   TRIG 258* 11/14/2013   CHOLHDL 9.3 11/14/2013    Lab Results HIV 1 RNA Quant (copies/mL)  Date Value  11/14/2013 35182*     CD4 T Cell Abs (/uL)  Date Value  11/14/2013 480  Assessment: He has newly diagnosed HIV infection but unfortunately has not had any decline in CD4 count. He is very eager to start treatment. I am waiting on his genotype and ADAP approval before starting therapy.  I talked to him about prevention for positives and the importance of future partner selection and condom use.  I talked to him about the importance of stopping cigarettes. He is in the contemplation phase.  Plan: 1. Start antiretroviral therapy once I have his genotype results and his ADAP has been approved 2. Prevention for positives counseling provided 3. Cigarette cessation counseling provided 4. Hepatitis A and B. vaccination   Cliffton AstersJohn Campbell, MD [facility] [phone] pager   [phone] cell 11/28/2013, 9:50 AM

## 2013-11-30 ENCOUNTER — Ambulatory Visit (INDEPENDENT_AMBULATORY_CARE_PROVIDER_SITE_OTHER): Payer: Self-pay | Admitting: Internal Medicine

## 2013-11-30 DIAGNOSIS — B20 Human immunodeficiency virus [HIV] disease: Secondary | ICD-10-CM

## 2013-11-30 LAB — HIV-1 GENOTYPR PLUS

## 2013-11-30 NOTE — Progress Notes (Signed)
Patient ID: Garrett Knight, male   DOB: Jul 05, 1965, 48 y.o.   MRN: [id number]          Patient Active Problem List   Diagnosis Date Noted  . Human immunodeficiency virus (HIV) disease 11/21/2013    Priority: High  . Genital warts 11/28/2013  . Cigarette smoker 11/28/2013  . Hypertriglyceridemia 11/28/2013    Patient's Medications  New Prescriptions   No medications on file  Previous Medications   No medications on file  Modified Medications   No medications on file  Discontinued Medications   SULFAMETHOXAZOLE-TRIMETHOPRIM (BACTRIM,SEPTRA) 400-80 MG PER TABLET    Take 1 tablet by mouth daily.    Subjective: Garrett Knight is in for his followup visit. He is still anxious about his recent HIV diagnosis. He is eager to start therapy and get back to work. He states that he wants to be a we'll make as much money as he can to help his 2 grown daughters who are nurses aides living in [place]. Review of Systems: Pertinent items are noted in HPI.  Past Medical History  Diagnosis Date  . High cholesterol   . Substance abuse     History  Substance Use Topics  . Smoking status: Current Every Day Smoker -- 1.50 packs/day for 41 years    Types: Cigarettes  . Smokeless tobacco: Not on file  . Alcohol Use: No     Comment: Past history of Cocaine, Marijuana and LSD use     Family History  Problem Relation Age of Onset  . Cancer Sister   . Diabetes Father   . Heart disease Father     No Known Allergies  Objective:   There is no weight on file to calculate BMI.  General: He is in no distress  Lab Results Lab Results  Component Value Date   WBC 7.0 11/14/2013   HGB 13.4 11/14/2013   HCT 39.5 11/14/2013   MCV 83.0 11/14/2013   PLT 102* 11/14/2013    Lab Results  Component Value Date   CREATININE 0.96 11/14/2013   BUN 18 11/14/2013   NA 132* 11/14/2013   K 3.7 11/14/2013   CL 98 11/14/2013   CO2 28 11/14/2013    Lab Results  Component Value Date   ALT 36 11/14/2013   AST  43* 11/14/2013   ALKPHOS 68 11/14/2013   BILITOT 0.5 11/14/2013    Lab Results  Component Value Date   CHOL 121 11/14/2013   HDL 13* 11/14/2013   LDLCALC 56 11/14/2013   TRIG 258* 11/14/2013   CHOLHDL 9.3 11/14/2013    Lab Results HIV 1 RNA Quant (copies/mL)  Date Value  11/14/2013 35182*     CD4 T Cell Abs (/uL)  Date Value  11/14/2013 480    HIV genotype: Pending ADAP approval: Pending   Assessment: Unfortunately there's been a delay in his genotype result. I would prefer to have those results before making a decision about antiretroviral therapy.  Plan: 1. Followup next week   Cliffton AstersJohn Zidan Helget, MD [facility] [phone] pager   [phone] cell 11/30/2013, 8:58 AM

## 2013-12-06 ENCOUNTER — Other Ambulatory Visit: Payer: Self-pay | Admitting: Licensed Clinical Social Worker

## 2013-12-06 ENCOUNTER — Ambulatory Visit (INDEPENDENT_AMBULATORY_CARE_PROVIDER_SITE_OTHER): Payer: Self-pay | Admitting: Internal Medicine

## 2013-12-06 ENCOUNTER — Telehealth: Payer: Self-pay | Admitting: *Deleted

## 2013-12-06 VITALS — BP 111/68 | HR 73 | Temp 98.0°F | Wt 168.8 lb

## 2013-12-06 DIAGNOSIS — B20 Human immunodeficiency virus [HIV] disease: Secondary | ICD-10-CM

## 2013-12-06 MED ORDER — ELVITEG-COBIC-EMTRICIT-TENOFDF 150-150-200-300 MG PO TABS: 1.0000 | ORAL_TABLET | Freq: Every day | ORAL | Status: DC

## 2013-12-06 NOTE — Progress Notes (Signed)
Patient ID: Garrett Knight, male   DOB: May 30, 1966, 48 y.o.   MRN: [id number]          Patient Active Problem List   Diagnosis Date Noted  . Human immunodeficiency virus (HIV) disease 11/21/2013    Priority: High  . Genital warts 11/28/2013  . Cigarette smoker 11/28/2013  . Hypertriglyceridemia 11/28/2013    Patient's Medications  New Prescriptions   ELVITEGRAVIR-COBICISTAT-EMTRICITABINE-TENOFOVIR (STRIBILD) 150-150-200-300 MG TABS TABLET    Take 1 tablet by mouth daily with breakfast.  Previous Medications   No medications on file  Modified Medications   No medications on file  Discontinued Medications   No medications on file    Subjective: Garrett Knight is in for his followup visit.   Review of Systems: Pertinent items are noted in HPI.  Past Medical History  Diagnosis Date  . High cholesterol   . Substance abuse     History  Substance Use Topics  . Smoking status: Current Every Day Smoker -- 1.50 packs/day for 41 years    Types: Cigarettes  . Smokeless tobacco: Not on file  . Alcohol Use: No     Comment: Past history of Cocaine, Marijuana and LSD use     Family History  Problem Relation Age of Onset  . Cancer Sister   . Diabetes Father   . Heart disease Father     No Known Allergies  Objective: Temp: 98 F (36.7 C) (07/08 1334) Temp src: Oral (07/08 1334) BP: 111/68 mmHg (07/08 1334) Pulse Rate: 73 (07/08 1334) Body mass index is 24.21 kg/(m^2).  General: He is in no distress  Lab Results Lab Results  Component Value Date   WBC 7.0 11/14/2013   HGB 13.4 11/14/2013   HCT 39.5 11/14/2013   MCV 83.0 11/14/2013   PLT 102* 11/14/2013    Lab Results  Component Value Date   CREATININE 0.96 11/14/2013   BUN 18 11/14/2013   NA 132* 11/14/2013   K 3.7 11/14/2013   CL 98 11/14/2013   CO2 28 11/14/2013    Lab Results  Component Value Date   ALT 36 11/14/2013   AST 43* 11/14/2013   ALKPHOS 68 11/14/2013   BILITOT 0.5 11/14/2013    Lab Results  Component  Value Date   CHOL 121 11/14/2013   HDL 13* 11/14/2013   LDLCALC 56 11/14/2013   TRIG 258* 11/14/2013   CHOLHDL 9.3 11/14/2013    Lab Results HIV 1 RNA Quant (copies/mL)  Date Value  11/14/2013 35182*     CD4 T Cell Abs (/uL)  Date Value  11/14/2013 480    HIV genotype: Wild type virus with no mutations    Assessment: He is ready to start therapy.   Plan: 1. Start Stribild 1 daily with food  2.  followup after lab work in 6 weeks    Cliffton AstersJohn Sama Arauz, MD [facility] [phone] pager   [phone] cell 12/06/2013, 1:46 PM

## 2013-12-06 NOTE — Telephone Encounter (Signed)
Pt needs to bring 2 signed rxes to [facility] after today's OV.  Neila Gearasey Norton, [facility], Case Manager will assist pt with first 30-day voucher and ADAP application after today's OV w/ Dr. Orvan Falconerampbell.

## 2014-01-17 ENCOUNTER — Other Ambulatory Visit (INDEPENDENT_AMBULATORY_CARE_PROVIDER_SITE_OTHER): Payer: Self-pay

## 2014-01-17 DIAGNOSIS — B20 Human immunodeficiency virus [HIV] disease: Secondary | ICD-10-CM

## 2014-01-17 LAB — COMPREHENSIVE METABOLIC PANEL
ALT: 22 U/L (ref 0–53)
AST: 30 U/L (ref 0–37)
Albumin: 3.6 g/dL (ref 3.5–5.2)
Alkaline Phosphatase: 61 U/L (ref 39–117)
BUN: 12 mg/dL (ref 6–23)
CO2: 27 mEq/L (ref 19–32)
CREATININE: 1.02 mg/dL (ref 0.50–1.35)
Calcium: 9.1 mg/dL (ref 8.4–10.5)
Chloride: 104 mEq/L (ref 96–112)
Glucose, Bld: 81 mg/dL (ref 70–99)
Potassium: 3.9 mEq/L (ref 3.5–5.3)
Sodium: 138 mEq/L (ref 135–145)
Total Bilirubin: 0.4 mg/dL (ref 0.2–1.2)
Total Protein: 7.7 g/dL (ref 6.0–8.3)

## 2014-01-17 LAB — CBC
HCT: 41.5 % (ref 39.0–52.0)
Hemoglobin: 14.4 g/dL (ref 13.0–17.0)
MCH: 29.2 pg (ref 26.0–34.0)
MCHC: 34.7 g/dL (ref 30.0–36.0)
MCV: 84.2 fL (ref 78.0–100.0)
PLATELETS: 144 10*3/uL — AB (ref 150–400)
RBC: 4.93 MIL/uL (ref 4.22–5.81)
RDW: 16.6 % — AB (ref 11.5–15.5)
WBC: 5.9 10*3/uL (ref 4.0–10.5)

## 2014-01-19 LAB — T-HELPER CELL (CD4) - (RCID CLINIC ONLY)
CD4 % Helper T Cell: 20 % — ABNORMAL LOW (ref 33–55)
CD4 T Cell Abs: 630 /uL (ref 400–2700)

## 2014-01-19 LAB — HIV-1 RNA QUANT-NO REFLEX-BLD
HIV 1 RNA Quant: <20 copies/mL (ref <20)
HIV-1 RNA Quant, Log: <1.3 {Log} (ref <1.30)

## 2014-02-01 ENCOUNTER — Ambulatory Visit (INDEPENDENT_AMBULATORY_CARE_PROVIDER_SITE_OTHER): Payer: Self-pay | Admitting: Internal Medicine

## 2014-02-01 ENCOUNTER — Ambulatory Visit: Payer: Self-pay | Admitting: Internal Medicine

## 2014-02-01 ENCOUNTER — Encounter: Payer: Self-pay | Admitting: Internal Medicine

## 2014-02-01 VITALS — BP 97/63 | HR 61 | Temp 98.1°F | Wt 168.0 lb

## 2014-02-01 DIAGNOSIS — B20 Human immunodeficiency virus [HIV] disease: Secondary | ICD-10-CM

## 2014-02-01 DIAGNOSIS — Z23 Encounter for immunization: Secondary | ICD-10-CM

## 2014-02-01 NOTE — Progress Notes (Signed)
Patient ID: Garrett Knight, male   DOB: [birth year], 48 y.o.   MRN: [id number]          Patient Active Problem List   Diagnosis Date Noted  . Human immunodeficiency virus (HIV) disease 11/21/2013    Priority: High  . Genital warts 11/28/2013  . Cigarette smoker 11/28/2013  . Hypertriglyceridemia 11/28/2013    Patient's Medications  New Prescriptions   No medications on file  Previous Medications   ELVITEGRAVIR-COBICISTAT-EMTRICITABINE-TENOFOVIR (STRIBILD) 150-150-200-300 MG TABS TABLET    Take 1 tablet by mouth daily with breakfast.  Modified Medications   No medications on file  Discontinued Medications   No medications on file    Subjective: Garrett Knight is in for his routine visit. He has not missed any doses of Stribild since she started it a few months ago. She's not having any problems tolerating it. He has a new job and will be working in [place] about returning home periodically. He wants to know if there any new curative therapies for his genital warts. He has tried Aldara cream in the past with temporary improvement. Review of Systems: Pertinent items are noted in HPI.  Past Medical History  Diagnosis Date  . High cholesterol   . Substance abuse     History  Substance Use Topics  . Smoking status: Current Every Day Smoker -- 1.50 packs/day for 41 years    Types: Cigarettes  . Smokeless tobacco: Not on file  . Alcohol Use: No     Comment: Past history of Cocaine, Marijuana and LSD use     Family History  Problem Relation Age of Onset  . Cancer Sister   . Diabetes Father   . Heart disease Father     No Known Allergies  Objective: Temp: 98.1 F (36.7 C) (09/03 0925) Temp src: Oral (09/03 0925) BP: 97/63 mmHg (09/03 0925) Pulse Rate: 61 (09/03 0925) Body mass index is 24.11 kg/(m^2).  General: He is in good spirits. His dry wit is intact. Oral: No oropharyngeal lesions Skin: No rash Lungs: Clear Cor: Regular S1 and S2 no murmurs  Lab  Results Lab Results  Component Value Date   WBC 5.9 01/17/2014   HGB 14.4 01/17/2014   HCT 41.5 01/17/2014   MCV 84.2 01/17/2014   PLT 144* 01/17/2014    Lab Results  Component Value Date   CREATININE 1.02 01/17/2014   BUN 12 01/17/2014   NA 138 01/17/2014   K 3.9 01/17/2014   CL 104 01/17/2014   CO2 27 01/17/2014    Lab Results  Component Value Date   ALT 22 01/17/2014   AST 30 01/17/2014   ALKPHOS 61 01/17/2014   BILITOT 0.4 01/17/2014    Lab Results  Component Value Date   CHOL 121 11/14/2013   HDL 13* 11/14/2013   LDLCALC 56 11/14/2013   TRIG 258* 11/14/2013   CHOLHDL 9.3 11/14/2013    Lab Results HIV 1 RNA Quant (copies/mL)  Date Value  01/17/2014 <20   11/14/2013 35182*     CD4 T Cell Abs (/uL)  Date Value  01/17/2014 630   11/14/2013 480      Assessment: His HIV infection has come under excellent control.  I talked to him about the importance of cigarette cessation.  There are currently no curative therapy for his genital warts. I offered to refill Aldara but he declined.  Plan: 1. Continue Stribild 2. Cigarette cessation counseling provided 3. Followup after lab work in 3 months   Jonny Ruiz  Orvan Falconer, MD [facility] [phone] pager   [phone] cell 02/01/2014, 9:50 AM

## 2014-03-06 ENCOUNTER — Ambulatory Visit: Payer: Self-pay | Admitting: Internal Medicine

## 2014-03-20 ENCOUNTER — Ambulatory Visit: Payer: Self-pay | Admitting: Internal Medicine

## 2014-05-03 ENCOUNTER — Other Ambulatory Visit (INDEPENDENT_AMBULATORY_CARE_PROVIDER_SITE_OTHER): Payer: Self-pay

## 2014-05-03 DIAGNOSIS — B20 Human immunodeficiency virus [HIV] disease: Secondary | ICD-10-CM

## 2014-05-04 LAB — T-HELPER CELL (CD4) - (RCID CLINIC ONLY)
CD4 T CELL HELPER: 21 % — AB (ref 33–55)
CD4 T Cell Abs: 550 /uL (ref 400–2700)

## 2014-05-05 LAB — HIV-1 RNA QUANT-NO REFLEX-BLD

## 2014-05-22 ENCOUNTER — Encounter: Payer: Self-pay | Admitting: Internal Medicine

## 2014-05-22 ENCOUNTER — Ambulatory Visit (INDEPENDENT_AMBULATORY_CARE_PROVIDER_SITE_OTHER): Payer: Self-pay | Admitting: Internal Medicine

## 2014-05-22 VITALS — BP 123/73 | HR 78 | Temp 98.0°F | Wt 168.0 lb

## 2014-05-22 DIAGNOSIS — Z23 Encounter for immunization: Secondary | ICD-10-CM

## 2014-05-22 DIAGNOSIS — A630 Anogenital (venereal) warts: Secondary | ICD-10-CM

## 2014-05-22 DIAGNOSIS — B20 Human immunodeficiency virus [HIV] disease: Secondary | ICD-10-CM

## 2014-05-22 MED ORDER — ELVITEG-COBIC-EMTRICIT-TENOFAF 150-150-200-10 MG PO TABS: 1.0000 | ORAL_TABLET | Freq: Every day | ORAL | Status: DC

## 2014-05-22 MED ORDER — IMIQUIMOD 5 % EX CREA: TOPICAL_CREAM | CUTANEOUS | Status: DC

## 2014-05-22 NOTE — Addendum Note (Signed)
Addended by: Wendall MolaOCKERHAM, JACQUELINE A on: 05/22/2014 11:48 AM   Modules accepted: Orders

## 2014-05-22 NOTE — Progress Notes (Signed)
Patient ID: Garrett Knight, male   DOB: Nov 24, 1965, 48 y.o.   MRN: [id number]          Patient Active Problem List   Diagnosis Date Noted  . Human immunodeficiency virus (HIV) disease 11/21/2013    Priority: High  . Genital warts 11/28/2013  . Cigarette smoker 11/28/2013  . Hypertriglyceridemia 11/28/2013    Patient's Medications  New Prescriptions   ELVITEGRAVIR-COBICISTAT-EMTRICITABINE-TENOFOVIR (GENVOYA) 150-150-200-10 MG TABS TABLET    Take 1 tablet by mouth daily with breakfast.   IMIQUIMOD (ALDARA) 5 % CREAM    Apply topically 3 (three) times a week.  Previous Medications   No medications on file  Modified Medications   No medications on file  Discontinued Medications   ELVITEGRAVIR-COBICISTAT-EMTRICITABINE-TENOFOVIR (STRIBILD) 150-150-200-300 MG TABS TABLET    Take 1 tablet by mouth daily with breakfast.    Subjective: Garrett Knight is in for his routine visit. 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 He has been working 60-80 hours each week. He is found that sometimes he forgets to take his Stribild on time each morning. He has not missed any doses but sometimes takes it 6-8 hours late. He recently wrote a note on the dashboard of his truck to remind him. In the past he has used imiquimod cream for his genital warts. They tend to go away when he uses the cream but then come back. Review of Systems: Pertinent items are noted in HPI.  Past Medical History  Diagnosis Date  . High cholesterol   . Substance abuse     History  Substance Use Topics  . Smoking status: Current Every Day Smoker -- 1.50 packs/day for 41 years    Types: Cigarettes  . Smokeless tobacco: Never Used  . Alcohol Use: No     Comment: Past history of Cocaine, Marijuana and LSD use     Family History  Problem Relation Age of Onset  . Cancer Sister   . Diabetes Father   . Heart disease Father     No Known Allergies  Objective: Temp: 98  F (36.7 C) (12/22 1019) Temp Source: Oral (12/22 1019) BP: 123/73 mmHg (12/22 1019) Pulse Rate: 78 (12/22 1019) Body mass index is 24.11 kg/(m^2).  General: He is in no distress Oral: No oropharyngeal lesions Skin: No rash Lungs: Clear Cor: Regular S1 and S2 with no murmurs  Lab Results Lab Results  Component Value Date   WBC 5.9 01/17/2014   HGB 14.4 01/17/2014   HCT 41.5 01/17/2014   MCV 84.2 01/17/2014   PLT 144* 01/17/2014    Lab Results  Component Value Date   CREATININE 1.02 01/17/2014   BUN 12 01/17/2014   NA 138 01/17/2014   K 3.9 01/17/2014   CL 104 01/17/2014   CO2 27 01/17/2014    Lab Results  Component Value Date   ALT 22 01/17/2014   AST 30 01/17/2014   ALKPHOS 61 01/17/2014   BILITOT 0.4 01/17/2014    Lab Results  Component Value Date   CHOL 121 11/14/2013   HDL 13* 11/14/2013   LDLCALC 56 11/14/2013   TRIG 258* 11/14/2013   CHOLHDL 9.3 11/14/2013    Lab Results HIV 1 RNA QUANT (copies/mL)  Date Value  05/03/2014 <20  01/17/2014 <20  11/14/2013 35182*   CD4 T CELL ABS (/uL)  Date Value  05/03/2014 550  01/17/2014 630  11/14/2013 480     Assessment: His HIV  infection is under very good control. I will change Stribild to the new version called Genvoya. I asked him to set his cell phone alarm to remind him to take his dose each morning.  Plan: 1. Change Stribild to Genvoya 2. Refill imiquimod cream 3. Follow-up after lab work in 6 months   Cliffton AstersJohn Alayia Meggison, MD [facility] [phone] pager   [phone] cell 05/22/2014, 10:47 AM

## 2014-06-04 ENCOUNTER — Telehealth: Payer: Self-pay | Admitting: *Deleted

## 2014-06-04 DIAGNOSIS — A630 Anogenital (venereal) warts: Secondary | ICD-10-CM

## 2014-06-04 MED ORDER — IMIQUIMOD 5 % EX CREA
TOPICAL_CREAM | CUTANEOUS | Status: DC
Start: 2014-06-04 — End: 2015-01-11

## 2014-06-04 NOTE — Telephone Encounter (Signed)
Needing Aldara rx sent to [facility].  Currently working out-of-town.  Was able to send to out-of-town pharmacy.  Pt pays out-of-pocket for his rxes.  Pt notified that rx was sent.

## 2014-10-03 ENCOUNTER — Telehealth: Payer: Self-pay | Admitting: *Deleted

## 2014-10-03 ENCOUNTER — Other Ambulatory Visit: Payer: Self-pay | Admitting: Internal Medicine

## 2014-10-03 ENCOUNTER — Encounter: Payer: Self-pay | Admitting: *Deleted

## 2014-10-03 ENCOUNTER — Other Ambulatory Visit: Payer: 59

## 2014-10-03 DIAGNOSIS — B20 Human immunodeficiency virus [HIV] disease: Secondary | ICD-10-CM

## 2014-10-03 DIAGNOSIS — Z8349 Family history of other endocrine, nutritional and metabolic diseases: Secondary | ICD-10-CM

## 2014-10-03 LAB — CBC
HCT: 42.2 % (ref 39.0–52.0)
HEMOGLOBIN: 14.6 g/dL (ref 13.0–17.0)
MCH: 31.3 pg (ref 26.0–34.0)
MCHC: 34.6 g/dL (ref 30.0–36.0)
MCV: 90.6 fL (ref 78.0–100.0)
MPV: 9.7 fL (ref 8.6–12.4)
Platelets: 158 10*3/uL (ref 150–400)
RBC: 4.66 MIL/uL (ref 4.22–5.81)
RDW: 14 % (ref 11.5–15.5)
WBC: 6.7 10*3/uL (ref 4.0–10.5)

## 2014-10-03 LAB — COMPREHENSIVE METABOLIC PANEL
ALT: 15 U/L (ref 0–53)
AST: 19 U/L (ref 0–37)
Albumin: 3.5 g/dL (ref 3.5–5.2)
Alkaline Phosphatase: 69 U/L (ref 39–117)
BILIRUBIN TOTAL: 0.4 mg/dL (ref 0.2–1.2)
BUN: 11 mg/dL (ref 6–23)
CHLORIDE: 107 meq/L (ref 96–112)
CO2: 27 meq/L (ref 19–32)
CREATININE: 1.04 mg/dL (ref 0.50–1.35)
Calcium: 8.9 mg/dL (ref 8.4–10.5)
GLUCOSE: 91 mg/dL (ref 70–99)
Potassium: 3.9 mEq/L (ref 3.5–5.3)
Sodium: 141 mEq/L (ref 135–145)
Total Protein: 6.9 g/dL (ref 6.0–8.3)

## 2014-10-03 LAB — LIPID PANEL
CHOL/HDL RATIO: 4.8 ratio
Cholesterol: 164 mg/dL (ref 0–200)
HDL: 34 mg/dL — ABNORMAL LOW (ref >=40)
LDL Cholesterol: 102 mg/dL — ABNORMAL HIGH (ref 0–99)
Triglycerides: 142 mg/dL (ref <150)
VLDL: 28 mg/dL (ref 0–40)

## 2014-10-03 NOTE — Telephone Encounter (Signed)
Pt requesting thyroid function test be added to labs.  Having labs drawn this afternoon.  MD please advise.

## 2014-10-03 NOTE — Telephone Encounter (Signed)
TSH ordered.

## 2014-10-04 LAB — TSH: TSH: 2.081 u[IU]/mL (ref 0.350–4.500)

## 2014-10-04 LAB — T-HELPER CELL (CD4) - (RCID CLINIC ONLY)
CD4 T CELL ABS: 760 /uL (ref 400–2700)
CD4 T CELL HELPER: 29 % — AB (ref 33–55)

## 2014-10-04 LAB — HIV-1 RNA QUANT-NO REFLEX-BLD
HIV 1 RNA Quant: <20 copies/mL (ref <20)
HIV-1 RNA Quant, Log: <1.3 {Log} (ref <1.30)

## 2014-10-04 LAB — RPR

## 2014-11-05 ENCOUNTER — Other Ambulatory Visit: Payer: Self-pay

## 2014-11-20 ENCOUNTER — Ambulatory Visit: Payer: Self-pay | Admitting: Internal Medicine

## 2014-11-25 ENCOUNTER — Other Ambulatory Visit: Payer: Self-pay | Admitting: Internal Medicine

## 2014-11-26 ENCOUNTER — Ambulatory Visit (INDEPENDENT_AMBULATORY_CARE_PROVIDER_SITE_OTHER): Payer: Commercial Indemnity | Admitting: Internal Medicine

## 2014-11-26 ENCOUNTER — Encounter: Payer: Self-pay | Admitting: Internal Medicine

## 2014-11-26 ENCOUNTER — Telehealth: Payer: Self-pay | Admitting: *Deleted

## 2014-11-26 DIAGNOSIS — B20 Human immunodeficiency virus [HIV] disease: Secondary | ICD-10-CM | POA: Diagnosis not present

## 2014-11-26 MED ORDER — ELVITEG-COBIC-EMTRICIT-TENOFAF 150-150-200-10 MG PO TABS: 1.0000 | ORAL_TABLET | Freq: Every day | ORAL | Status: DC

## 2014-11-26 NOTE — Progress Notes (Signed)
Patient ID: Garrett Knight, male   DOB: 1966-04-06, 49 y.o.   MRN: [id number]          Patient Active Problem List   Diagnosis Date Noted  . Human immunodeficiency virus (HIV) disease 11/21/2013    Priority: High  . Genital warts 11/28/2013  . Cigarette smoker 11/28/2013  . Hypertriglyceridemia 11/28/2013    Patient's Medications  New Prescriptions   No medications on file  Previous Medications   IMIQUIMOD (ALDARA) 5 % CREAM    Apply topically 3 (three) times a week.  Modified Medications   Modified Medication Previous Medication   ELVITEGRAVIR-COBICISTAT-EMTRICITABINE-TENOFOVIR (GENVOYA) 150-150-200-10 MG TABS TABLET elvitegravir-cobicistat-emtricitabine-tenofovir (GENVOYA) 150-150-200-10 MG TABS tablet      Take 1 tablet by mouth daily with breakfast.    Take 1 tablet by mouth daily with breakfast.  Discontinued Medications   STRIBILD 150-150-200-300 MG TABS TABLET    TAKE 1 TABLET BY MOUTH DAILY WITH BREAKFAST    Subjective: Garrett Knight is in for his routine HIV follow-up visit. He did not make the switch from Stribild to [place] after his last visit. He is not sure why that did not happen. He continues to take Stribild without difficulty. He takes it each morning but sometimes is in a hurry and takes it on an empty stomach. He recently developed extremely cold hands while working outside in 24 weather. He is also had some intermittent aching in his left leg. His brother and his father are both being treated for hypothyroidism. He states that he has read about hypothyroidism and thinks that his symptoms could also be related to hypothyroidism.  Review of Systems: Pertinent items are noted in HPI.  Past Medical History  Diagnosis Date  . High cholesterol   . Substance abuse     History  Substance Use Topics  . Smoking status: Current Every Day Smoker -- 1.50 packs/day for 41 years    Types: Cigarettes  . Smokeless tobacco: Never Used     Comment: not ready to quit  .  Alcohol Use: No     Comment: Past history of Cocaine, Marijuana and LSD use     Family History  Problem Relation Age of Onset  . Cancer Sister   . Diabetes Father   . Heart disease Father   . Hypothyroidism Father   . Hypothyroidism Brother     No Known Allergies  Objective: Temp: 98 F (36.7 C) (06/27 1442) Temp Source: Oral (06/27 1442) BP: 115/74 mmHg (06/27 1442) Pulse Rate: 74 (06/27 1442) Body mass index is 25.43 kg/(m^2).  General: He is in no distress. He has somewhat pressured speech and appears worried as usual.   Lab Results Lab Results  Component Value Date   WBC 6.7 10/03/2014   HGB 14.6 10/03/2014   HCT 42.2 10/03/2014   MCV 90.6 10/03/2014   PLT 158 10/03/2014    Lab Results  Component Value Date   CREATININE 1.04 10/03/2014   BUN 11 10/03/2014   NA 141 10/03/2014   K 3.9 10/03/2014   CL 107 10/03/2014   CO2 27 10/03/2014    Lab Results  Component Value Date   ALT 15 10/03/2014   AST 19 10/03/2014   ALKPHOS 69 10/03/2014   BILITOT 0.4 10/03/2014    Lab Results  Component Value Date   CHOL 164 10/03/2014   HDL 34* 10/03/2014   LDLCALC 102* 10/03/2014   TRIG 142 10/03/2014   CHOLHDL 4.8 10/03/2014    Lab Results HIV 1 RNA  QUANT (copies/mL)  Date Value  10/03/2014 <20  05/03/2014 <20  01/17/2014 <20   CD4 T CELL ABS (/uL)  Date Value  10/03/2014 760  05/03/2014 550  01/17/2014 630     Assessment: His HIV infection is under excellent control. I will change him to [facility]. I talked with him and also had our ID pharmacist talk with him today about the importance of taking Genvoya with a meal.  His TSH is normal. He does not have hypothyroidism.  Plan: 1. Change to Genvoya once daily with a meal 2. Follow-up after lab work in 6 months   Cliffton AstersJohn Muslima Toppins, MD [facility] [phone] pager   [phone] cell 11/26/2014, 3:10 PM

## 2014-11-26 NOTE — Telephone Encounter (Signed)
Left message asking if patient was taking Stribild or Genvoya.  Patient requested refill of Stribild, but was [facility]. He is to follow up with Dr. Orvan Falconer today. Andree Coss, RN

## 2014-12-04 ENCOUNTER — Telehealth: Payer: Self-pay | Admitting: *Deleted

## 2014-12-04 DIAGNOSIS — B20 Human immunodeficiency virus [HIV] disease: Secondary | ICD-10-CM

## 2014-12-04 NOTE — Telephone Encounter (Signed)
[facility], the pt has used up the co-pay card and is now paying over $1300/month for his medication.  His most recent refill was for Stribild not Genvoya.  Rob, Teacher, early years/prepharmacist, is making sure that the next refill will be for Genvoya.  RN will connect the patient tomorrow with P. Yetta BarreJones to explore resources for pt assistance since his co-pay card is no longer working.

## 2014-12-05 MED ORDER — ELVITEG-COBIC-EMTRICIT-TENOFDF 150-150-200-300 MG PO TABS: 1.0000 | ORAL_TABLET | Freq: Every day | ORAL | Status: DC

## 2014-12-05 NOTE — Telephone Encounter (Signed)
Please keep him on Stribild until [place] is approved.

## 2014-12-05 NOTE — Addendum Note (Signed)
Addended by: Jennet MaduroESTRIDGE, DENISE D on: 12/05/2014 05:20 PM   Modules accepted: Orders, Medications

## 2015-01-11 ENCOUNTER — Other Ambulatory Visit: Payer: Self-pay | Admitting: *Deleted

## 2015-01-11 ENCOUNTER — Telehealth: Payer: Self-pay | Admitting: *Deleted

## 2015-01-11 DIAGNOSIS — A630 Anogenital (venereal) warts: Secondary | ICD-10-CM

## 2015-01-11 MED ORDER — IMIQUIMOD 5 % EX CREA: TOPICAL_CREAM | CUTANEOUS | Status: DC

## 2015-01-11 NOTE — Telephone Encounter (Signed)
Patient called requesting an Rx for "warts" this was left on voicemail. He requested it be sent to [facility] in [place]. Called back and got his voicemail and stated needed name of med and phone number where he needed called in. He called back over lunch left another voicemail with phone number; however no name of medication. Per Angelique Blonder RN she stated he traveled for his job. Phoned in Aldara cream, with one refill to [phone] as this is the only med for this condition listed in his current Rx profile. Wendall Mola CMA

## 2015-02-13 ENCOUNTER — Telehealth: Payer: Self-pay

## 2015-02-13 DIAGNOSIS — A630 Anogenital (venereal) warts: Secondary | ICD-10-CM

## 2015-02-13 MED ORDER — IMIQUIMOD 5 % EX CREA: TOPICAL_CREAM | CUTANEOUS | Status: DC

## 2015-02-13 NOTE — Telephone Encounter (Signed)
Patient left message on machine with request for genital warts medication and stating he is currently in [place].    Aldara called to [facility] in [place], [place].  [phone] per patient request.   Laurell Josephs, RN

## 2015-04-09 ENCOUNTER — Telehealth: Payer: Self-pay | Admitting: *Deleted

## 2015-04-09 DIAGNOSIS — A630 Anogenital (venereal) warts: Secondary | ICD-10-CM

## 2015-04-09 NOTE — Telephone Encounter (Signed)
Patient called stating the Aldara cream is not helping for genital warts. He is requesting an "acid" that has helped in the past. He does not recall the name. He wants it sent to the [facility] in [place], [place]. Please advise

## 2015-04-15 MED ORDER — PODOFILOX 0.5 % EX GEL: Freq: Two times a day (BID) | CUTANEOUS | Status: DC

## 2015-04-15 NOTE — Telephone Encounter (Signed)
I send in a prescription for podofilox.

## 2015-04-15 NOTE — Addendum Note (Signed)
Addended by: Cliffton AstersAMPBELL, Byard Carranza on: 04/15/2015 01:44 PM   Modules accepted: Orders

## 2015-04-17 NOTE — Telephone Encounter (Signed)
Patient notified

## 2015-04-29 ENCOUNTER — Other Ambulatory Visit: Payer: Commercial Indemnity

## 2015-04-29 DIAGNOSIS — B20 Human immunodeficiency virus [HIV] disease: Secondary | ICD-10-CM

## 2015-04-30 LAB — HIV-1 RNA QUANT-NO REFLEX-BLD: HIV-1 RNA Quant, Log: <1.3 Log copies/mL (ref <1.30)

## 2015-04-30 LAB — T-HELPER CELL (CD4) - (RCID CLINIC ONLY)
CD4 T CELL HELPER: 29 % — AB (ref 33–55)
CD4 T Cell Abs: 630 /uL (ref 400–2700)

## 2015-05-13 ENCOUNTER — Ambulatory Visit (INDEPENDENT_AMBULATORY_CARE_PROVIDER_SITE_OTHER): Payer: Commercial Indemnity | Admitting: Internal Medicine

## 2015-05-13 ENCOUNTER — Encounter: Payer: Self-pay | Admitting: Internal Medicine

## 2015-05-13 VITALS — BP 123/80 | HR 64 | Temp 97.9°F | Wt 184.0 lb

## 2015-05-13 DIAGNOSIS — Z720 Tobacco use: Secondary | ICD-10-CM

## 2015-05-13 DIAGNOSIS — B20 Human immunodeficiency virus [HIV] disease: Secondary | ICD-10-CM | POA: Diagnosis not present

## 2015-05-13 DIAGNOSIS — Z23 Encounter for immunization: Secondary | ICD-10-CM

## 2015-05-13 DIAGNOSIS — F17210 Nicotine dependence, cigarettes, uncomplicated: Secondary | ICD-10-CM

## 2015-05-13 NOTE — Progress Notes (Signed)
Patient Active Problem List   Diagnosis Date Noted  . Human immunodeficiency virus (HIV) disease (HCC) 11/21/2013    Priority: High  . Genital warts 11/28/2013  . Cigarette smoker 11/28/2013  . Hypertriglyceridemia 11/28/2013    Patient's Medications  New Prescriptions   No medications on file  Previous Medications   ELVITEGRAVIR-COBICISTAT-EMTRICITABINE-TENOFOVIR (GENVOYA) 150-150-200-10 MG TABS TABLET    Take 1 tablet by mouth daily with breakfast.   IMIQUIMOD (ALDARA) 5 % CREAM    Apply topically 3 (three) times a week.   PODOFILOX (CONDYLOX) 0.5 % GEL    Apply topically 2 (two) times daily. Apply 2 times daily for 3 days. Wait 4 days before repeating. You may repeat up to 3 times  Modified Medications   No medications on file  Discontinued Medications   ELVITEGRAVIR-COBICISTAT-EMTRICITABINE-TENOFOVIR (STRIBILD) 150-150-200-300 MG TABS TABLET    Take 1 tablet by mouth daily with breakfast.    Subjective: Garrett Knight is in for his routine HIV follow-up visit. He takes his Genvoya every morning at 5 AM with breakfast. He has not missed any doses. I prescribed podofilox and this has helped his genital warts. He continues to smoke cigarettes and has no current plan to quit. He continues to work out of town most of the time and usually 12 hour days at least 5 days each week.   Review of Systems: Review of Systems  Constitutional: Negative for fever, chills, weight loss, malaise/fatigue and diaphoresis.  HENT: Negative for sore throat.   Respiratory: Negative for cough, sputum production and shortness of breath.   Cardiovascular: Negative for chest pain.  Gastrointestinal: Negative for nausea, vomiting and diarrhea.  Skin: Negative for rash.  Psychiatric/Behavioral: Negative for depression and substance abuse. The patient is not nervous/anxious.     Past Medical History  Diagnosis Date  . High cholesterol   . Substance abuse     Social History  Substance Use Topics   . Smoking status: Current Every Day Smoker -- 1.50 packs/day for 41 years    Types: Cigarettes  . Smokeless tobacco: Never Used     Comment: not ready to quit  . Alcohol Use: No     Comment: Past history of Cocaine, Marijuana and LSD use     Family History  Problem Relation Age of Onset  . Cancer Sister   . Diabetes Father   . Heart disease Father   . Hypothyroidism Father   . Hypothyroidism Brother     No Known Allergies  Objective:  Filed Vitals:   05/13/15 1436  BP: 123/80  Pulse: 64  Temp: 97.9 F (36.6 C)  TempSrc: Oral  Weight: 184 lb (83.462 kg)   Body mass index is 26.4 kg/(m^2).  Physical Exam  Constitutional:  He is his usual gruff self but actually in fairly good spirits.  HENT:  Mouth/Throat: No oropharyngeal exudate.  Cardiovascular: Normal rate and regular rhythm.   No murmur heard. Pulmonary/Chest: Breath sounds normal.  Skin: No rash noted.  Psychiatric: Mood and affect normal.    Lab Results Lab Results  Component Value Date   WBC 6.7 10/03/2014   HGB 14.6 10/03/2014   HCT 42.2 10/03/2014   MCV 90.6 10/03/2014   PLT 158 10/03/2014    Lab Results  Component Value Date   CREATININE 1.04 10/03/2014   BUN 11 10/03/2014   NA 141 10/03/2014   K 3.9 10/03/2014   CL 107 10/03/2014   CO2 27 10/03/2014  Lab Results  Component Value Date   ALT 15 10/03/2014   AST 19 10/03/2014   ALKPHOS 69 10/03/2014   BILITOT 0.4 10/03/2014    Lab Results  Component Value Date   CHOL 164 10/03/2014   HDL 34* 10/03/2014   LDLCALC 102* 10/03/2014   TRIG 142 10/03/2014   CHOLHDL 4.8 10/03/2014    Lab Results HIV 1 RNA QUANT (copies/mL)  Date Value  04/29/2015 <20  10/03/2014 <20  05/03/2014 <20   CD4 T CELL ABS (/uL)  Date Value  04/29/2015 630  10/03/2014 760  05/03/2014 550      Problem List Items Addressed This Visit      High   Human immunodeficiency virus (HIV) disease (HCC)    His infection remains under excellent control.  He will continue Genvoya and follow-up after lab work in 6 months.      Relevant Orders   T-helper cell (CD4)- ([facility] clinic only)   HIV 1 RNA quant-no reflex-bld   CBC   Comprehensive metabolic panel   Lipid panel   RPR     Unprioritized   Cigarette smoker - Primary    I talked him about the importance of cigarette cessation and asked him to consider quitting.           Cliffton Asters, MD [facility] [phone] pager   [phone] cell 05/13/2015, 2:56 PM

## 2015-05-13 NOTE — Assessment & Plan Note (Signed)
His infection remains under excellent control. He will continue Genvoya and follow-up after lab work in 6 months. 

## 2015-05-13 NOTE — Assessment & Plan Note (Signed)
I talked him about the importance of cigarette cessation and asked him to consider quitting.

## 2015-05-14 ENCOUNTER — Other Ambulatory Visit: Payer: Commercial Indemnity

## 2015-05-28 ENCOUNTER — Ambulatory Visit: Payer: Commercial Indemnity | Admitting: Internal Medicine

## 2015-10-29 ENCOUNTER — Other Ambulatory Visit: Payer: Commercial Indemnity

## 2015-10-29 DIAGNOSIS — B20 Human immunodeficiency virus [HIV] disease: Secondary | ICD-10-CM

## 2015-10-29 LAB — COMPREHENSIVE METABOLIC PANEL
ALT: 14 U/L (ref 9–46)
AST: 17 U/L (ref 10–35)
Albumin: 3.7 g/dL (ref 3.6–5.1)
Alkaline Phosphatase: 74 U/L (ref 40–115)
BILIRUBIN TOTAL: 0.5 mg/dL (ref 0.2–1.2)
BUN: 18 mg/dL (ref 7–25)
CHLORIDE: 103 mmol/L (ref 98–110)
CO2: 26 mmol/L (ref 20–31)
CREATININE: 1.15 mg/dL (ref 0.70–1.33)
Calcium: 8.9 mg/dL (ref 8.6–10.3)
GLUCOSE: 97 mg/dL (ref 65–99)
Potassium: 4.1 mmol/L (ref 3.5–5.3)
SODIUM: 138 mmol/L (ref 135–146)
Total Protein: 6.9 g/dL (ref 6.1–8.1)

## 2015-10-29 LAB — CBC
HCT: 44 % (ref 38.5–50.0)
HEMOGLOBIN: 15.3 g/dL (ref 13.2–17.1)
MCH: 32.1 pg (ref 27.0–33.0)
MCHC: 34.8 g/dL (ref 32.0–36.0)
MCV: 92.4 fL (ref 80.0–100.0)
MPV: 9.5 fL (ref 7.5–12.5)
PLATELETS: 151 10*3/uL (ref 140–400)
RBC: 4.76 MIL/uL (ref 4.20–5.80)
RDW: 13.9 % (ref 11.0–15.0)
WBC: 8.4 10*3/uL (ref 3.8–10.8)

## 2015-10-29 LAB — LIPID PANEL
Cholesterol: 200 mg/dL (ref 125–200)
HDL: 46 mg/dL (ref >=40)
LDL CALC: 116 mg/dL (ref <130)
TRIGLYCERIDES: 191 mg/dL — AB (ref <150)
Total CHOL/HDL Ratio: 4.3 Ratio (ref <=5.0)
VLDL: 38 mg/dL — AB (ref <30)

## 2015-10-30 LAB — T-HELPER CELL (CD4) - (RCID CLINIC ONLY)
CD4 % Helper T Cell: 28 % — ABNORMAL LOW (ref 33–55)
CD4 T CELL ABS: 620 /uL (ref 400–2700)

## 2015-10-30 LAB — RPR

## 2015-10-30 LAB — HIV-1 RNA QUANT-NO REFLEX-BLD

## 2015-11-12 ENCOUNTER — Encounter: Payer: Self-pay | Admitting: Internal Medicine

## 2015-11-12 ENCOUNTER — Ambulatory Visit (INDEPENDENT_AMBULATORY_CARE_PROVIDER_SITE_OTHER): Payer: Commercial Indemnity | Admitting: Internal Medicine

## 2015-11-12 DIAGNOSIS — B20 Human immunodeficiency virus [HIV] disease: Secondary | ICD-10-CM

## 2015-11-12 MED ORDER — ELVITEG-COBIC-EMTRICIT-TENOFAF 150-150-200-10 MG PO TABS: 1.0000 | ORAL_TABLET | Freq: Every day | ORAL | Status: DC

## 2015-11-12 NOTE — Assessment & Plan Note (Signed)
His infection remains under perfect control. We will help him sign a per specialty pharmacy to help cut down the cost of co-pays. He will follow-up after lab work in 6 months.

## 2015-11-12 NOTE — Progress Notes (Signed)
Patient Active Problem List   Diagnosis Date Noted  . Human immunodeficiency virus (HIV) disease (HCC) 11/21/2013    Priority: High  . Genital warts 11/28/2013  . Cigarette smoker 11/28/2013  . Hypertriglyceridemia 11/28/2013    Patient's Medications  New Prescriptions   No medications on file  Previous Medications   ELVITEGRAVIR-COBICISTAT-EMTRICITABINE-TENOFOVIR (GENVOYA) 150-150-200-10 MG TABS TABLET    Take 1 tablet by mouth daily with breakfast.   IMIQUIMOD (ALDARA) 5 % CREAM    Apply topically 3 (three) times a week.   PODOFILOX (CONDYLOX) 0.5 % GEL    Apply topically 2 (two) times daily. Apply 2 times daily for 3 days. Wait 4 days before repeating. You may repeat up to 3 times  Modified Medications   No medications on file  Discontinued Medications   No medications on file    Subjective: Garrett Knight is in for his routine HIV follow-up visit. He denies missing any doses of his Genvoya. He takes it each morning at 5 AM with breakfast. He is going to be taking a job in [place] but will be returning to [employer]. With his income and insurance he is looking at having to pay $750 each month for his Genvoya. He is not using his [facility] pharmacy yet.   Review of Systems: Review of Systems  Constitutional: Negative for fever, chills, weight loss and diaphoresis.  Psychiatric/Behavioral: Negative for depression and substance abuse. The patient is not nervous/anxious.     Past Medical History  Diagnosis Date  . High cholesterol   . Substance abuse     Social History  Substance Use Topics  . Smoking status: Current Every Day Smoker -- 1.50 packs/day for 41 years    Types: Cigarettes  . Smokeless tobacco: Never Used     Comment: not ready to quit  . Alcohol Use: No     Comment: Past history of Cocaine, Marijuana and LSD use     Family History  Problem Relation Age of Onset  . Cancer Sister   . Diabetes Father   . Heart disease  Father   . Hypothyroidism Father   . Hypothyroidism Brother     No Known Allergies  Objective:  Filed Vitals:   11/12/15 0851  BP: 115/71  Pulse: 52  Temp: 98.1 F (36.7 C)  TempSrc: Oral  Height:  (1.778 m)  Weight: 188 lb (85.276 kg)   Body mass index is 26.98 kg/(m^2).  Physical Exam  Constitutional: He is oriented to person, place, and time. No distress.  Neurological: He is alert and oriented to person, place, and time.  Skin: No rash noted.  Psychiatric: Mood and affect normal.    Lab Results Lab Results  Component Value Date   WBC 8.4 10/29/2015   HGB 15.3 10/29/2015   HCT 44.0 10/29/2015   MCV 92.4 10/29/2015   PLT 151 10/29/2015    Lab Results  Component Value Date   CREATININE 1.15 10/29/2015   BUN 18 10/29/2015   NA 138 10/29/2015   K 4.1 10/29/2015   CL 103 10/29/2015   CO2 26 10/29/2015    Lab Results  Component Value Date   ALT 14 10/29/2015   AST 17 10/29/2015   ALKPHOS 74 10/29/2015   BILITOT 0.5 10/29/2015    Lab Results  Component Value Date   CHOL 200 10/29/2015   HDL 46 10/29/2015   LDLCALC 116 10/29/2015   TRIG 191* 10/29/2015  CHOLHDL 4.3 10/29/2015    Lab Results HIV 1 RNA QUANT (copies/mL)  Date Value  10/29/2015 <20  04/29/2015 <20  10/03/2014 <20   CD4 T CELL ABS (/uL)  Date Value  10/29/2015 620  04/29/2015 630  10/03/2014 760      Problem List Items Addressed This Visit      High   Human immunodeficiency virus (HIV) disease (HCC)    His infection remains under perfect control. We will help him sign a per specialty pharmacy to help cut down the cost of co-pays. He will follow-up after lab work in 6 months.      Relevant Orders   T-helper cell (CD4)- ([facility] clinic only)   HIV 1 RNA quant-no reflex-bld   CBC   Comprehensive metabolic panel   Lipid panel   RPR        Garrett AstersJohn Quantae Martel, MD [facility] [phone] pager   [phone] cell 11/12/2015,  9:15 AM

## 2015-11-12 NOTE — Addendum Note (Signed)
Addended by: Andree CossHOWELL, Crystalina Stodghill M on: 11/12/2015 09:41 AM   Modules accepted: Orders

## 2015-11-19 ENCOUNTER — Other Ambulatory Visit: Payer: Self-pay | Admitting: *Deleted

## 2015-11-19 DIAGNOSIS — B20 Human immunodeficiency virus [HIV] disease: Secondary | ICD-10-CM

## 2015-11-19 MED ORDER — ELVITEG-COBIC-EMTRICIT-TENOFAF 150-150-200-10 MG PO TABS: 1.0000 | ORAL_TABLET | Freq: Every day | ORAL | Status: DC

## 2015-11-30 ENCOUNTER — Other Ambulatory Visit: Payer: Self-pay | Admitting: Internal Medicine

## 2015-11-30 DIAGNOSIS — B20 Human immunodeficiency virus [HIV] disease: Secondary | ICD-10-CM

## 2016-01-24 ENCOUNTER — Encounter: Payer: Self-pay | Admitting: Internal Medicine

## 2016-02-24 ENCOUNTER — Other Ambulatory Visit: Payer: Self-pay | Admitting: Internal Medicine

## 2016-02-24 DIAGNOSIS — B20 Human immunodeficiency virus [HIV] disease: Secondary | ICD-10-CM

## 2016-03-24 ENCOUNTER — Other Ambulatory Visit: Payer: Self-pay | Admitting: *Deleted

## 2016-03-24 DIAGNOSIS — B20 Human immunodeficiency virus [HIV] disease: Secondary | ICD-10-CM

## 2016-03-24 MED ORDER — ELVITEG-COBIC-EMTRICIT-TENOFAF 150-150-200-10 MG PO TABS: ORAL_TABLET | ORAL | 5 refills | Status: DC

## 2016-03-24 NOTE — Telephone Encounter (Signed)
Patient called and advised he needs a PA for his Genvoya. Advised the patient we have not received any notice of this so I will check with the pharmacy and once we get it we will start the process. Advised it may take 24-72 hours depending on his insurance company.  Called the patient pharmacy and they had to re-run the prescription and advised if they get PA notification they will fax it to the office.

## 2016-03-25 ENCOUNTER — Encounter: Payer: Self-pay | Admitting: *Deleted

## 2016-04-29 ENCOUNTER — Other Ambulatory Visit: Payer: BLUE CROSS/BLUE SHIELD

## 2016-04-29 DIAGNOSIS — B20 Human immunodeficiency virus [HIV] disease: Secondary | ICD-10-CM

## 2016-04-29 LAB — COMPREHENSIVE METABOLIC PANEL
ALT: 16 U/L (ref 9–46)
AST: 18 U/L (ref 10–35)
Albumin: 3.6 g/dL (ref 3.6–5.1)
Alkaline Phosphatase: 69 U/L (ref 40–115)
BUN: 20 mg/dL (ref 7–25)
CO2: 25 mmol/L (ref 20–31)
Calcium: 8.9 mg/dL (ref 8.6–10.3)
Chloride: 106 mmol/L (ref 98–110)
Creat: 1.16 mg/dL (ref 0.70–1.33)
GLUCOSE: 95 mg/dL (ref 65–99)
Potassium: 3.8 mmol/L (ref 3.5–5.3)
Sodium: 139 mmol/L (ref 135–146)
TOTAL PROTEIN: 6.5 g/dL (ref 6.1–8.1)
Total Bilirubin: 0.3 mg/dL (ref 0.2–1.2)

## 2016-04-29 LAB — LIPID PANEL
CHOL/HDL RATIO: 6.2 ratio — AB (ref <5.0)
Cholesterol: 167 mg/dL (ref <200)
HDL: 27 mg/dL — ABNORMAL LOW (ref >40)
LDL Cholesterol: 89 mg/dL (ref <100)
Triglycerides: 255 mg/dL — ABNORMAL HIGH (ref <150)
VLDL: 51 mg/dL — AB (ref <30)

## 2016-04-29 LAB — CBC
HCT: 40.3 % (ref 38.5–50.0)
Hemoglobin: 13.8 g/dL (ref 13.2–17.1)
MCH: 31.5 pg (ref 27.0–33.0)
MCHC: 34.2 g/dL (ref 32.0–36.0)
MCV: 92 fL (ref 80.0–100.0)
MPV: 9.5 fL (ref 7.5–12.5)
PLATELETS: 159 10*3/uL (ref 140–400)
RBC: 4.38 MIL/uL (ref 4.20–5.80)
RDW: 13.5 % (ref 11.0–15.0)
WBC: 7.6 10*3/uL (ref 3.8–10.8)

## 2016-04-30 LAB — HIV-1 RNA QUANT-NO REFLEX-BLD: HIV-1 RNA Quant, Log: <1.3 Log copies/mL (ref <1.30)

## 2016-04-30 LAB — T-HELPER CELL (CD4) - (RCID CLINIC ONLY)
CD4 % Helper T Cell: 31 % — ABNORMAL LOW (ref 33–55)
CD4 T CELL ABS: 870 /uL (ref 400–2700)

## 2016-04-30 LAB — RPR

## 2016-05-13 ENCOUNTER — Ambulatory Visit: Payer: Commercial Indemnity | Admitting: Internal Medicine

## 2016-05-18 ENCOUNTER — Encounter: Payer: Self-pay | Admitting: Internal Medicine

## 2016-05-18 ENCOUNTER — Ambulatory Visit (INDEPENDENT_AMBULATORY_CARE_PROVIDER_SITE_OTHER): Payer: BLUE CROSS/BLUE SHIELD | Admitting: Internal Medicine

## 2016-05-18 DIAGNOSIS — F17210 Nicotine dependence, cigarettes, uncomplicated: Secondary | ICD-10-CM

## 2016-05-18 DIAGNOSIS — B20 Human immunodeficiency virus [HIV] disease: Secondary | ICD-10-CM | POA: Diagnosis not present

## 2016-05-18 DIAGNOSIS — A630 Anogenital (venereal) warts: Secondary | ICD-10-CM

## 2016-05-18 MED ORDER — IMIQUIMOD 5 % EX CREA: TOPICAL_CREAM | CUTANEOUS | 2 refills | Status: DC

## 2016-05-18 MED ORDER — ELVITEG-COBIC-EMTRICIT-TENOFAF 150-150-200-10 MG PO TABS: ORAL_TABLET | ORAL | 5 refills | Status: DC

## 2016-05-18 NOTE — Progress Notes (Signed)
HPI: Garrett Knight is a 50 y.o. male who is here for his HIV f/u visit.   Allergies: No Known Allergies  Vitals: Temp: 98.1 F (36.7 C) (12/18 1606) Temp Source: Oral (12/18 1606) BP: 119/78 (12/18 1606) Pulse Rate: 67 (12/18 1606)  Past Medical History: Past Medical History:  Diagnosis Date  . High cholesterol   . Substance abuse     Social History: Social History   Social History  . Marital status: Divorced    Spouse name: N/A  . Number of children: N/A  . Years of education: N/A   Social History Main Topics  . Smoking status: Current Every Day Smoker    Packs/day: 1.00    Years: 41.00    Types: Cigarettes  . Smokeless tobacco: Never Used     Comment: not ready to quit  . Alcohol use No     Comment: Past history of Cocaine, Marijuana and LSD use   . Drug use: No  . Sexual activity: No     Comment: given condons   Other Topics Concern  . None   Social History Narrative  . None    Previous Regimen:   Current Regimen: Genvoya  Labs: HIV 1 RNA Quant (copies/mL)  Date Value  04/29/2016 <20  10/29/2015 <20  04/29/2015 <20   CD4 T Cell Abs (/uL)  Date Value  04/29/2016 870  10/29/2015 620  04/29/2015 630   Hep B S Ab (no units)  Date Value  11/14/2013 NEG   Hepatitis B Surface Ag (no units)  Date Value  11/14/2013 NEGATIVE   HCV Ab (no units)  Date Value  11/14/2013 NEGATIVE    CrCl: Estimated Creatinine Clearance: 84.8 mL/min (by C-G formula based on SCr of 1.16 mg/dL).  Lipids:    Component Value Date/Time   CHOL 167 04/29/2016 1003   TRIG 255 (H) 04/29/2016 1003   HDL 27 (L) 04/29/2016 1003   CHOLHDL 6.2 (H) 04/29/2016 1003   VLDL 51 (H) 04/29/2016 1003   LDLCALC 89 04/29/2016 1003    Assessment: Garrett Knight is here for his HIV f/u visit. He is well controlled on Genvoya. Dr. Orvan Falconerampbell pulled me into his case due to a pharmacy issue. Apparently, he has [employer] (not [place]) and he has been getting [facility] meds at [facility]. I asked him why  because [facility] is usually not a preferred [facility] of [facility]. He has been paying it up front and his "co-pay" card covered some of it. He then would apply for reimbursement from his company but he doesn't get reimbursed fully. Therefore, he is asking us if it's possible to get it filled elsewhere like [facility] since is on the move all the time due to work. I told him that the likelihood is no because BCBS would force people to do mail order. Mail order would be a terrible option since he is moving around all the time. I told him that he needs to call up his insurance company to appeal so that it can be filled outside of the specialty pharmacy. I'm not sure if he is going to do it but I did tell him that it'll likely be the only option.   Recommendations:  Cont Genvoya Call BCBS to see if they will allow meds to be filled outside of specialty  Garrett Knight, Garrett Knight, PharmD, BCPS, AAHIVP, CPP Clinical Infectious Disease Pharmacist [facility] 05/18/2016, 9:12 PM

## 2016-05-18 NOTE — Progress Notes (Signed)
Patient Active Problem List   Diagnosis Date Noted  . Human immunodeficiency virus (HIV) disease (HCC) 11/21/2013    Priority: High  . Genital warts 11/28/2013  . Cigarette smoker 11/28/2013  . Hypertriglyceridemia 11/28/2013    Patient's Medications  New Prescriptions   No medications on file  Previous Medications   ELVITEGRAVIR-COBICISTAT-EMTRICITABINE-TENOFOVIR (GENVOYA) 150-150-200-10 MG TABS TABLET    TAKE ONE TABLET BY MOUTH ONCE DAILY WITH BREAKFAST   PODOFILOX (CONDYLOX) 0.5 % GEL    Apply topically 2 (two) times daily. Apply 2 times daily for 3 days. Wait 4 days before repeating. You may repeat up to 3 times  Modified Medications   Modified Medication Previous Medication   IMIQUIMOD (ALDARA) 5 % CREAM imiquimod (ALDARA) 5 % cream      Apply topically 3 (three) times a week.    Apply topically 3 (three) times a week.  Discontinued Medications   No medications on file    Subjective: Garrett NeedleMichael is in for his routine HIV follow-up visit. He never misses a single dose of his Genvoya. He is still working in [place] but is not sure if his contract will be extended into next year. He continues to smoke cigarettes and has no current plan to quit. He states that he has been smoking since he was 50 years old. He did use the imiquimod cream and his genital warts went away.   Review of Systems: Review of Systems  Constitutional: Negative for chills, diaphoresis, fever, malaise/fatigue and weight loss.  HENT: Negative for sore throat.   Respiratory: Negative for cough, sputum production and shortness of breath.   Cardiovascular: Negative for chest pain.  Gastrointestinal: Negative for abdominal pain, diarrhea, heartburn, nausea and vomiting.  Genitourinary: Negative for dysuria and frequency.  Musculoskeletal: Negative for joint pain and myalgias.  Skin: Negative for rash.  Neurological: Negative for dizziness and headaches.  Psychiatric/Behavioral: Negative for depression  and substance abuse. The patient is not nervous/anxious.     Past Medical History:  Diagnosis Date  . High cholesterol   . Substance abuse     Social History  Substance Use Topics  . Smoking status: Current Every Day Smoker    Packs/day: 1.00    Years: 41.00    Types: Cigarettes  . Smokeless tobacco: Never Used     Comment: not ready to quit  . Alcohol use No     Comment: Past history of Cocaine, Marijuana and LSD use     Family History  Problem Relation Age of Onset  . Cancer Sister   . Diabetes Father   . Heart disease Father   . Hypothyroidism Father   . Hypothyroidism Brother     No Known Allergies  Objective:  Vitals:   05/18/16 1606  BP: 119/78  Pulse: 67  Temp: 98.1 F (36.7 C)  TempSrc: Oral  Weight: 197 lb (89.4 kg)  Height: 5\' 10"  (1.778 m)   Body mass index is 28.27 kg/m.  Physical Exam  Constitutional: He is oriented to person, place, and time.  He is in good spirits. He has his cantankerous self.  HENT:  Mouth/Throat: No oropharyngeal exudate.  Eyes: Conjunctivae are normal.  Cardiovascular: Normal rate and regular rhythm.   No murmur heard. Pulmonary/Chest: Effort normal and breath sounds normal. He has no wheezes. He has no rales.  Abdominal: Soft. He exhibits no mass. There is no tenderness.  Musculoskeletal: Normal range of motion.  Neurological: He is alert  and oriented to person, place, and time. Gait normal.  Skin: No rash noted.  Psychiatric: Mood and affect normal.  Vitals reviewed.   Lab Results Lab Results  Component Value Date   WBC 7.6 04/29/2016   HGB 13.8 04/29/2016   HCT 40.3 04/29/2016   MCV 92.0 04/29/2016   PLT 159 04/29/2016    Lab Results  Component Value Date   CREATININE 1.16 04/29/2016   BUN 20 04/29/2016   NA 139 04/29/2016   K 3.8 04/29/2016   CL 106 04/29/2016   CO2 25 04/29/2016    Lab Results  Component Value Date   ALT 16 04/29/2016   AST 18 04/29/2016   ALKPHOS 69 04/29/2016   BILITOT  0.3 04/29/2016    Lab Results  Component Value Date   CHOL 167 04/29/2016   HDL 27 (L) 04/29/2016   LDLCALC 89 04/29/2016   TRIG 255 (H) 04/29/2016   CHOLHDL 6.2 (H) 04/29/2016   HIV 1 RNA Quant (copies/mL)  Date Value  04/29/2016 <20  10/29/2015 <20  04/29/2015 <20   CD4 T Cell Abs (/uL)  Date Value  04/29/2016 870  10/29/2015 620  04/29/2015 630     Problem List Items Addressed This Visit      High   Human immunodeficiency virus (HIV) disease (HCC)    His infection remains under excellent, long-term control. He will continue Genvoya. We will [facility] as he has [facility]. He will follow-up after lab work in one year.      Relevant Medications   imiquimod (ALDARA) 5 % cream   Other Relevant Orders   T-helper cell (CD4)- ([facility] clinic only)   HIV 1 RNA quant-no reflex-bld   CBC   Comprehensive metabolic panel   Lipid panel   RPR     Unprioritized   Cigarette smoker    I told him that his HIV should never cause him any significant harm but continued cigarette smoking almost certainly will. I encouraged him to consider trying to quit next year.      Genital warts    He has had a good response to imiquimod cream. I did give him refills in case they recur.      Relevant Medications   imiquimod (ALDARA) 5 % cream        Cliffton AstersJohn Coriana Angello, MD [facility] [phone] pager   [phone] cell 05/18/2016, 4:48 PM

## 2016-05-18 NOTE — Assessment & Plan Note (Signed)
His infection remains under excellent, long-term control. He will continue Genvoya. We will [facility]. He will follow-up after lab work in one year.

## 2016-05-18 NOTE — Assessment & Plan Note (Signed)
He has had a good response to imiquimod cream. I did give him refills in case they recur.

## 2016-05-18 NOTE — Assessment & Plan Note (Signed)
I told him that his HIV should never cause him any significant harm but continued cigarette smoking almost certainly will. I encouraged him to consider trying to quit next year.

## 2016-05-19 MED ORDER — IMIQUIMOD 5 % EX CREA: TOPICAL_CREAM | CUTANEOUS | 2 refills | Status: DC

## 2016-11-18 ENCOUNTER — Other Ambulatory Visit: Payer: Self-pay | Admitting: Internal Medicine

## 2016-11-18 DIAGNOSIS — B20 Human immunodeficiency virus [HIV] disease: Secondary | ICD-10-CM

## 2016-11-19 ENCOUNTER — Telehealth: Payer: Self-pay | Admitting: *Deleted

## 2016-11-19 DIAGNOSIS — B20 Human immunodeficiency virus [HIV] disease: Secondary | ICD-10-CM

## 2016-11-19 MED ORDER — ELVITEG-COBIC-EMTRICIT-TENOFAF 150-150-200-10 MG PO TABS: ORAL_TABLET | ORAL | 5 refills | Status: DC

## 2016-11-19 NOTE — Telephone Encounter (Signed)
[facility] [address] in [place]

## 2016-11-30 ENCOUNTER — Other Ambulatory Visit: Payer: BLUE CROSS/BLUE SHIELD

## 2016-11-30 ENCOUNTER — Encounter (INDEPENDENT_AMBULATORY_CARE_PROVIDER_SITE_OTHER): Payer: Self-pay

## 2016-11-30 DIAGNOSIS — B20 Human immunodeficiency virus [HIV] disease: Secondary | ICD-10-CM

## 2016-11-30 LAB — CBC
HCT: 43.6 % (ref 38.5–50.0)
Hemoglobin: 14.6 g/dL (ref 13.2–17.1)
MCH: 31.5 pg (ref 27.0–33.0)
MCHC: 33.5 g/dL (ref 32.0–36.0)
MCV: 94 fL (ref 80.0–100.0)
MPV: 9.5 fL (ref 7.5–12.5)
PLATELETS: 182 10*3/uL (ref 140–400)
RBC: 4.64 MIL/uL (ref 4.20–5.80)
RDW: 13.7 % (ref 11.0–15.0)
WBC: 8 10*3/uL (ref 3.8–10.8)

## 2016-11-30 LAB — COMPREHENSIVE METABOLIC PANEL
ALK PHOS: 63 U/L (ref 40–115)
ALT: 15 U/L (ref 9–46)
AST: 15 U/L (ref 10–35)
Albumin: 3.7 g/dL (ref 3.6–5.1)
BUN: 16 mg/dL (ref 7–25)
CO2: 24 mmol/L (ref 20–31)
Calcium: 9 mg/dL (ref 8.6–10.3)
Chloride: 106 mmol/L (ref 98–110)
Creat: 1.19 mg/dL (ref 0.70–1.33)
GLUCOSE: 87 mg/dL (ref 65–99)
POTASSIUM: 3.9 mmol/L (ref 3.5–5.3)
Sodium: 139 mmol/L (ref 135–146)
Total Bilirubin: 0.4 mg/dL (ref 0.2–1.2)
Total Protein: 6.8 g/dL (ref 6.1–8.1)

## 2016-11-30 LAB — LIPID PANEL
CHOL/HDL RATIO: 4.7 ratio (ref <5.0)
Cholesterol: 164 mg/dL (ref <200)
HDL: 35 mg/dL — ABNORMAL LOW (ref >40)
LDL Cholesterol: 93 mg/dL (ref <100)
Triglycerides: 182 mg/dL — ABNORMAL HIGH (ref <150)
VLDL: 36 mg/dL — AB (ref <30)

## 2016-12-01 LAB — T-HELPER CELL (CD4) - (RCID CLINIC ONLY)
CD4 % Helper T Cell: 33 % (ref 33–55)
CD4 T Cell Abs: 950 /uL (ref 400–2700)

## 2016-12-02 LAB — RPR

## 2016-12-03 LAB — HIV-1 RNA QUANT-NO REFLEX-BLD
HIV 1 RNA QUANT: DETECTED {copies}/mL — AB
HIV-1 RNA QUANT, LOG: DETECTED {Log_copies}/mL — AB

## 2016-12-07 ENCOUNTER — Encounter: Payer: Self-pay | Admitting: Internal Medicine

## 2016-12-14 ENCOUNTER — Ambulatory Visit: Payer: BLUE CROSS/BLUE SHIELD | Admitting: Internal Medicine

## 2017-01-19 ENCOUNTER — Other Ambulatory Visit: Payer: Self-pay | Admitting: Internal Medicine

## 2017-01-19 DIAGNOSIS — B20 Human immunodeficiency virus [HIV] disease: Secondary | ICD-10-CM

## 2017-05-27 ENCOUNTER — Encounter: Payer: Self-pay | Admitting: Internal Medicine

## 2017-05-28 ENCOUNTER — Encounter: Payer: Self-pay | Admitting: *Deleted

## 2017-05-31 ENCOUNTER — Other Ambulatory Visit: Payer: BLUE CROSS/BLUE SHIELD

## 2017-05-31 ENCOUNTER — Other Ambulatory Visit: Payer: Self-pay | Admitting: *Deleted

## 2017-05-31 DIAGNOSIS — B20 Human immunodeficiency virus [HIV] disease: Secondary | ICD-10-CM

## 2017-05-31 LAB — COMPLETE METABOLIC PANEL WITH GFR
AG RATIO: 1.2 (calc) (ref 1.0–2.5)
ALT: 16 U/L (ref 9–46)
AST: 17 U/L (ref 10–35)
Albumin: 3.6 g/dL (ref 3.6–5.1)
Alkaline phosphatase (APISO): 70 U/L (ref 40–115)
BILIRUBIN TOTAL: 0.3 mg/dL (ref 0.2–1.2)
BUN: 15 mg/dL (ref 7–25)
CHLORIDE: 106 mmol/L (ref 98–110)
CO2: 28 mmol/L (ref 20–32)
Calcium: 8.9 mg/dL (ref 8.6–10.3)
Creat: 1.23 mg/dL (ref 0.70–1.33)
GFR, EST AFRICAN AMERICAN: 78 mL/min/{1.73_m2} (ref >=60)
GFR, Est Non African American: 67 mL/min/{1.73_m2} (ref >=60)
Globulin: 2.9 g/dL (calc) (ref 1.9–3.7)
Glucose, Bld: 122 mg/dL — ABNORMAL HIGH (ref 65–99)
POTASSIUM: 3.8 mmol/L (ref 3.5–5.3)
Sodium: 140 mmol/L (ref 135–146)
Total Protein: 6.5 g/dL (ref 6.1–8.1)

## 2017-05-31 LAB — CBC WITH DIFFERENTIAL/PLATELET
Basophils Absolute: 72 cells/uL (ref 0–200)
Basophils Relative: 0.9 %
EOS ABS: 320 {cells}/uL (ref 15–500)
Eosinophils Relative: 4 %
HCT: 42.2 % (ref 38.5–50.0)
Hemoglobin: 14.8 g/dL (ref 13.2–17.1)
Lymphs Abs: 2592 cells/uL (ref 850–3900)
MCH: 31.8 pg (ref 27.0–33.0)
MCHC: 35.1 g/dL (ref 32.0–36.0)
MCV: 90.8 fL (ref 80.0–100.0)
MPV: 10 fL (ref 7.5–12.5)
Monocytes Relative: 8.5 %
NEUTROS PCT: 54.2 %
Neutro Abs: 4336 cells/uL (ref 1500–7800)
PLATELETS: 159 10*3/uL (ref 140–400)
RBC: 4.65 10*6/uL (ref 4.20–5.80)
RDW: 12.6 % (ref 11.0–15.0)
TOTAL LYMPHOCYTE: 32.4 %
WBC: 8 10*3/uL (ref 3.8–10.8)
WBCMIX: 680 {cells}/uL (ref 200–950)

## 2017-06-02 LAB — T-HELPER CELL (CD4) - (RCID CLINIC ONLY)
CD4 T CELL ABS: 870 /uL (ref 400–2700)
CD4 T CELL HELPER: 32 % — AB (ref 33–55)

## 2017-06-03 LAB — HIV-1 RNA QUANT-NO REFLEX-BLD
HIV 1 RNA QUANT: NOT DETECTED {copies}/mL
HIV-1 RNA QUANT, LOG: NOT DETECTED {Log_copies}/mL

## 2017-06-16 ENCOUNTER — Ambulatory Visit: Payer: BLUE CROSS/BLUE SHIELD | Admitting: Internal Medicine

## 2017-06-21 ENCOUNTER — Other Ambulatory Visit: Payer: Self-pay | Admitting: Pharmacist

## 2017-06-21 ENCOUNTER — Encounter: Payer: Self-pay | Admitting: Internal Medicine

## 2017-06-21 ENCOUNTER — Ambulatory Visit (INDEPENDENT_AMBULATORY_CARE_PROVIDER_SITE_OTHER): Payer: BLUE CROSS/BLUE SHIELD | Admitting: Internal Medicine

## 2017-06-21 ENCOUNTER — Other Ambulatory Visit: Payer: Self-pay | Admitting: Pharmacist Clinician (PhC)/ Clinical Pharmacy Specialist

## 2017-06-21 ENCOUNTER — Other Ambulatory Visit: Payer: Self-pay

## 2017-06-21 DIAGNOSIS — Z23 Encounter for immunization: Secondary | ICD-10-CM

## 2017-06-21 DIAGNOSIS — B20 Human immunodeficiency virus [HIV] disease: Secondary | ICD-10-CM

## 2017-06-21 MED ORDER — ELVITEG-COBIC-EMTRICIT-TENOFAF 150-150-200-10 MG PO TABS: ORAL_TABLET | ORAL | 5 refills | Status: DC

## 2017-06-21 MED FILL — GENVOYA TABLET: 150-150-200 | 30 days supply | Qty: 30 | Fill #0

## 2017-06-21 NOTE — Assessment & Plan Note (Signed)
Excellent, long-term control.  He will continue Genvoya and follow-up after lab work in 1 year.  He received his influenza vaccination today.

## 2017-06-21 NOTE — Progress Notes (Signed)
[facility]  Patient Active Problem List   Diagnosis Date Noted  . Human immunodeficiency virus (HIV) disease (HCC) 11/21/2013    Priority: High  . Genital warts 11/28/2013  . Cigarette smoker 11/28/2013  . Hypertriglyceridemia 11/28/2013    Patient's Medications  New Prescriptions   No medications on file  Previous Medications   GENVOYA 150-150-200-10 MG TABS TABLET    TAKE 1 TABLET BY MOUTH ONCE DAILY WITH BREAKFAST   IMIQUIMOD (ALDARA) 5 % CREAM    Apply topically 3 (three) times a week.   PODOFILOX (CONDYLOX) 0.5 % GEL    Apply topically 2 (two) times daily. Apply 2 times daily for 3 days. Wait 4 days before repeating. You may repeat up to 3 times  Modified Medications   No medications on file  Discontinued Medications   No medications on file    Subjective: Garrett Knight is in for his routine HIV follow-up visit.  He has not had any problems obtaining, taking or tolerating Genvoya and never misses a single dose.  He is not on any other medications.  He is feeling well.  He is still smoking cigarettes and has no current plan to quit.  Review of Systems: Review of Systems  Constitutional: Negative for chills, diaphoresis, fever, malaise/fatigue and weight loss.  HENT: Negative for sore throat.   Respiratory: Negative for cough, sputum production and shortness of breath.   Cardiovascular: Negative for chest pain.  Gastrointestinal: Negative for abdominal pain, diarrhea, heartburn, nausea and vomiting.  Genitourinary: Negative for dysuria and frequency.  Musculoskeletal: Negative for joint pain and myalgias.  Skin: Negative for rash.  Neurological: Negative for dizziness and headaches.  Psychiatric/Behavioral: Negative for depression and substance abuse. The patient is not nervous/anxious.     Past Medical History:  Diagnosis Date  . High cholesterol   . Substance abuse (HCC)     Social History   Tobacco Use  . Smoking status:  Current Every Day Smoker    Packs/day: 1.00    Years: 41.00    Pack years: 41.00    Types: Cigarettes  . Smokeless tobacco: Never Used  . Tobacco comment: not ready to quit  Substance Use Topics  . Alcohol use: No    Alcohol/week: 0.0 oz    Comment: Past history of Cocaine, Marijuana and LSD use   . Drug use: No    Family History  Problem Relation Age of Onset  . Cancer Sister   . Diabetes Father   . Heart disease Father   . Hypothyroidism Father   . Hypothyroidism Brother     No Known Allergies  Objective: Vitals:   06/21/17 1350  BP: 115/77  Pulse: (!) 52  Temp: 97.6 F (36.4 C)  TempSrc: Oral  Weight: 182 lb (82.6 kg)   Body mass index is 26.11 kg/m.  Physical Exam  Constitutional: He is oriented to person, place, and time.  HENT:  Mouth/Throat: No oropharyngeal exudate.  Eyes: Conjunctivae are normal.  Cardiovascular: Normal rate and regular rhythm.  No murmur heard. Pulmonary/Chest: Effort normal and breath sounds normal.  Abdominal: Soft. He exhibits no mass. There is no tenderness.  Musculoskeletal: Normal range of motion.  Neurological: He is alert and oriented to person, place, and time.  Skin: No rash noted.  Psychiatric: Mood and affect normal.    Lab Results    Problem List Items Addressed This Visit      High   Human  immunodeficiency virus (HIV) disease (HCC)    Excellent, long-term control.  He will continue Genvoya and follow-up after lab work in 1 year.  He received his influenza vaccination today.      Relevant Orders   CBC   T-helper cell (CD4)- ([facility] clinic only)   Comprehensive metabolic panel   Lipid panel   RPR   HIV 1 RNA quant-no reflex-bld       Cliffton AstersJohn Samer Dutton, MD [facility] [phone] pager   [phone] cell 06/21/2017, 2:11 PM

## 2017-07-23 MED FILL — GENVOYA TABLET: 150-150-200 | 30 days supply | Qty: 30 | Fill #1

## 2017-08-20 MED FILL — GENVOYA TABLET: 150-150-200 | 30 days supply | Qty: 30 | Fill #2

## 2017-09-13 ENCOUNTER — Encounter: Payer: Self-pay | Admitting: Internal Medicine

## 2017-09-13 ENCOUNTER — Other Ambulatory Visit: Payer: Self-pay | Admitting: *Deleted

## 2017-09-13 DIAGNOSIS — B20 Human immunodeficiency virus [HIV] disease: Secondary | ICD-10-CM

## 2017-09-13 MED ORDER — ELVITEG-COBIC-EMTRICIT-TENOFAF 150-150-200-10 MG PO TABS
ORAL_TABLET | ORAL | 5 refills | Status: DC
Start: 2017-09-13 — End: 2017-09-13

## 2017-09-13 MED ORDER — ELVITEG-COBIC-EMTRICIT-TENOFAF 150-150-200-10 MG PO TABS: ORAL_TABLET | ORAL | 5 refills | Status: DC

## 2017-11-15 ENCOUNTER — Encounter: Payer: Self-pay | Admitting: Internal Medicine

## 2017-11-16 ENCOUNTER — Encounter: Payer: Self-pay | Admitting: Internal Medicine

## 2018-03-19 ENCOUNTER — Other Ambulatory Visit: Payer: Self-pay | Admitting: Internal Medicine

## 2018-03-19 DIAGNOSIS — B20 Human immunodeficiency virus [HIV] disease: Secondary | ICD-10-CM

## 2018-03-21 ENCOUNTER — Other Ambulatory Visit: Payer: Self-pay | Admitting: Behavioral Health

## 2018-03-21 DIAGNOSIS — B20 Human immunodeficiency virus [HIV] disease: Secondary | ICD-10-CM

## 2018-03-21 MED ORDER — ELVITEG-COBIC-EMTRICIT-TENOFAF 150-150-200-10 MG PO TABS: ORAL_TABLET | ORAL | 2 refills | Status: DC

## 2018-04-04 ENCOUNTER — Other Ambulatory Visit: Payer: BLUE CROSS/BLUE SHIELD

## 2018-04-04 DIAGNOSIS — B20 Human immunodeficiency virus [HIV] disease: Secondary | ICD-10-CM

## 2018-04-05 LAB — T-HELPER CELL (CD4) - (RCID CLINIC ONLY)
CD4 % Helper T Cell: 29 % — ABNORMAL LOW (ref 33–55)
CD4 T CELL ABS: 780 /uL (ref 400–2700)

## 2018-04-06 LAB — LIPID PANEL
Cholesterol: 165 mg/dL (ref <200)
HDL: 38 mg/dL — ABNORMAL LOW (ref >40)
LDL Cholesterol (Calc): 102 mg/dL (calc) — ABNORMAL HIGH
Non-HDL Cholesterol (Calc): 127 mg/dL (calc) (ref <130)
Total CHOL/HDL Ratio: 4.3 (calc) (ref <5.0)
Triglycerides: 152 mg/dL — ABNORMAL HIGH (ref <150)

## 2018-04-06 LAB — COMPREHENSIVE METABOLIC PANEL
AG RATIO: 1.1 (calc) (ref 1.0–2.5)
ALBUMIN MSPROF: 3.7 g/dL (ref 3.6–5.1)
ALT: 14 U/L (ref 9–46)
AST: 19 U/L (ref 10–35)
Alkaline phosphatase (APISO): 73 U/L (ref 40–115)
BUN: 19 mg/dL (ref 7–25)
CHLORIDE: 105 mmol/L (ref 98–110)
CO2: 27 mmol/L (ref 20–32)
Calcium: 9.1 mg/dL (ref 8.6–10.3)
Creat: 1.17 mg/dL (ref 0.70–1.33)
GLOBULIN: 3.3 g/dL (ref 1.9–3.7)
GLUCOSE: 84 mg/dL (ref 65–99)
POTASSIUM: 4 mmol/L (ref 3.5–5.3)
SODIUM: 139 mmol/L (ref 135–146)
TOTAL PROTEIN: 7 g/dL (ref 6.1–8.1)
Total Bilirubin: 0.4 mg/dL (ref 0.2–1.2)

## 2018-04-06 LAB — CBC
HCT: 44 % (ref 38.5–50.0)
Hemoglobin: 14.8 g/dL (ref 13.2–17.1)
MCH: 31.5 pg (ref 27.0–33.0)
MCHC: 33.6 g/dL (ref 32.0–36.0)
MCV: 93.6 fL (ref 80.0–100.0)
MPV: 10 fL (ref 7.5–12.5)
Platelets: 176 10*3/uL (ref 140–400)
RBC: 4.7 10*6/uL (ref 4.20–5.80)
RDW: 12.4 % (ref 11.0–15.0)
WBC: 7 10*3/uL (ref 3.8–10.8)

## 2018-04-06 LAB — HIV-1 RNA QUANT-NO REFLEX-BLD
HIV 1 RNA Quant: <20 copies/mL — AB
HIV-1 RNA Quant, Log: <1.3 Log copies/mL — AB

## 2018-04-06 LAB — RPR: RPR Ser Ql: NONREACTIVE

## 2018-04-18 ENCOUNTER — Encounter: Payer: Self-pay | Admitting: Internal Medicine

## 2018-04-18 ENCOUNTER — Ambulatory Visit (INDEPENDENT_AMBULATORY_CARE_PROVIDER_SITE_OTHER): Payer: BLUE CROSS/BLUE SHIELD | Admitting: Internal Medicine

## 2018-04-18 DIAGNOSIS — Z23 Encounter for immunization: Secondary | ICD-10-CM

## 2018-04-18 DIAGNOSIS — B20 Human immunodeficiency virus [HIV] disease: Secondary | ICD-10-CM

## 2018-04-18 MED ORDER — BICTEGRAVIR-EMTRICITAB-TENOFOV 50-200-25 MG PO TABS: 1.0000 | ORAL_TABLET | Freq: Every day | ORAL | 11 refills | Status: DC

## 2018-04-18 NOTE — Progress Notes (Signed)
Patient Active Problem List   Diagnosis Date Noted  . Human immunodeficiency virus (HIV) disease (HCC) 11/21/2013    Priority: High  . Genital warts 11/28/2013  . Cigarette smoker 11/28/2013  . Hypertriglyceridemia 11/28/2013    Patient's Medications  New Prescriptions   No medications on file  Previous Medications   ELVITEGRAVIR-COBICISTAT-EMTRICITABINE-TENOFOVIR (GENVOYA) 150-150-200-10 MG TABS TABLET    TAKE 1 TABLET BY MOUTH ONCE DAILY WITH BREAKFAST   IMIQUIMOD (ALDARA) 5 % CREAM    Apply topically 3 (three) times a week.   PODOFILOX (CONDYLOX) 0.5 % GEL    Apply topically 2 (two) times daily. Apply 2 times daily for 3 days. Wait 4 days before repeating. You may repeat up to 3 times  Modified Medications   No medications on file  Discontinued Medications   No medications on file    Subjective: Garrett Knight is in for his routine HIV follow-up visit.  He has not had any problems obtaining, taking or tolerating his Genvoya.  He says that his monthly co-pay is around $700.  He says that the co-pay card only helped him for about 2-1/2 months this year.  He takes his Genvoya each morning and now rarely misses doses.  He does not always take it with food.  He has no current desire or plan to quit smoking cigarettes.  He is in a relationship and has been sexually active with his male partner.  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  He says the job he is well but is very boring.  Review of Systems: Review of Systems  Constitutional: Negative for chills, diaphoresis, fever and weight loss.  Respiratory: Negative for cough.   Cardiovascular: Negative for chest pain.  Gastrointestinal: Negative for abdominal pain, diarrhea, nausea and vomiting.  Neurological: Negative for headaches.  Psychiatric/Behavioral: Negative for depression.    Past Medical History:  Diagnosis Date  . High cholesterol   . Substance abuse (HCC)     Social History    Tobacco Use  . Smoking status: Current Every Day Smoker    Packs/day: 1.00    Years: 41.00    Pack years: 41.00    Types: Cigarettes  . Smokeless tobacco: Never Used  . Tobacco comment: not ready to quit  Substance Use Topics  . Alcohol use: No    Alcohol/week: 0.0 standard drinks    Comment: Past history of Cocaine, Marijuana and LSD use   . Drug use: No    Family History  Problem Relation Age of Onset  . Cancer Sister   . Diabetes Father   . Heart disease Father   . Hypothyroidism Father   . Hypothyroidism Brother     No Known Allergies  Health Maintenance  Topic Date Due  . COLONOSCOPY  04/11/2015  . INFLUENZA VACCINE  12/30/2017  . TETANUS/TDAP  06/08/2027  . HIV Screening  Completed    Objective:  Vitals:   04/18/18 1509  BP: 111/70  Pulse: (!) 55  Weight: 183 lb (83 kg)   Body mass index is 26.26 kg/m.  Physical Exam  Constitutional: He is oriented to person, place, and time.  He is in good spirits.  HENT:  Mouth/Throat: No oropharyngeal exudate.  Cardiovascular: Normal rate, regular rhythm and normal heart sounds.  Pulmonary/Chest: Effort normal and breath sounds normal.  Abdominal: Soft. He exhibits no distension. There is no tenderness.  Neurological: He is alert and oriented to person, place, and  time.  Skin: No rash noted.  Psychiatric: He has a normal mood and affect.    Lab Results Lab Results  Component Value Date   WBC 7.0 04/04/2018   HGB 14.8 04/04/2018   HCT 44.0 04/04/2018   MCV 93.6 04/04/2018   PLT 176 04/04/2018    Lab Results  Component Value Date   CREATININE 1.17 04/04/2018   BUN 19 04/04/2018   NA 139 04/04/2018   K 4.0 04/04/2018   CL 105 04/04/2018   CO2 27 04/04/2018    Lab Results  Component Value Date   ALT 14 04/04/2018   AST 19 04/04/2018   ALKPHOS 63 11/30/2016   BILITOT 0.4 04/04/2018    Lab Results  Component Value Date   CHOL 165 04/04/2018   HDL 38 (L) 04/04/2018   LDLCALC 102 (H)  04/04/2018   TRIG 152 (H) 04/04/2018   CHOLHDL 4.3 04/04/2018   Lab Results  Component Value Date   LABRPR NON-REACTIVE 04/04/2018   HIV 1 RNA Quant (copies/mL)  Date Value  04/04/2018 <20 DETECTED (A)  05/31/2017 <20 NOT DETECTED  11/30/2016 <20 DETECTED (A)   CD4 T Cell Abs (/uL)  Date Value  04/04/2018 780  05/31/2017 870  11/30/2016 950     Problem List Items Addressed This Visit      High   Human immunodeficiency virus (HIV) disease (HCC)    His infection has been under excellent, long-term control since starting therapy for years ago.  I will change him to [facility] which he can take with or without food.  We are going to see if there is any way we can help him reduce his co-pays.  The importance of limiting the number of partners he has in the future, getting his partners tested and using condoms.  He received his influenza vaccine today.  He will follow-up after lab work in 1 year.           Cliffton Asters, MD [facility] [phone] pager   [phone] cell 04/18/2018, 3:36 PM

## 2018-04-18 NOTE — Assessment & Plan Note (Signed)
His infection has been under excellent, long-term control since starting therapy for years ago.  [facility] which he can take with or without food.  We are going to see if there is any way we can help him reduce his co-pays.  The importance of limiting the number of partners he has in the future, getting his partners tested and using condoms.  He received his influenza vaccine today.  He will follow-up after lab work in 1 year.

## 2018-04-18 NOTE — Progress Notes (Signed)
HPI: Garrett Knight is a 52 y.o. male who presents to the [facility] for his yearly HIV visit with Dr. Orvan Falconerampbell.  Patient Active Problem List   Diagnosis Date Noted  . Genital warts 11/28/2013  . Cigarette smoker 11/28/2013  . Hypertriglyceridemia 11/28/2013  . Human immunodeficiency virus (HIV) disease (HCC) 11/21/2013    Patient's Medications  New Prescriptions   BICTEGRAVIR-EMTRICITABINE-TENOFOVIR AF (BIKTARVY) 50-200-25 MG TABS TABLET    Take 1 tablet by mouth daily.  Previous Medications   ELVITEGRAVIR-COBICISTAT-EMTRICITABINE-TENOFOVIR (GENVOYA) 150-150-200-10 MG TABS TABLET    TAKE 1 TABLET BY MOUTH ONCE DAILY WITH BREAKFAST   IMIQUIMOD (ALDARA) 5 % CREAM    Apply topically 3 (three) times a week.   PODOFILOX (CONDYLOX) 0.5 % GEL    Apply topically 2 (two) times daily. Apply 2 times daily for 3 days. Wait 4 days before repeating. You may repeat up to 3 times  Modified Medications   No medications on file  Discontinued Medications   No medications on file    Allergies: No Known Allergies  Past Medical History: Past Medical History:  Diagnosis Date  . High cholesterol   . Substance abuse (HCC)     Social History: Social History   Socioeconomic History  . Marital status: Divorced    Spouse name: Not on file  . Number of children: Not on file  . Years of education: Not on file  . Highest education level: Not on file  Occupational History  . Not on file  Social Needs  . Financial resource strain: Not on file  . Food insecurity:    Worry: Not on file    Inability: Not on file  . Transportation needs:    Medical: Not on file    Non-medical: Not on file  Tobacco Use  . Smoking status: Current Every Day Smoker    Packs/day: 1.00    Years: 41.00    Pack years: 41.00    Types: Cigarettes  . Smokeless tobacco: Never Used  . Tobacco comment: not ready to quit  Substance and Sexual Activity  . Alcohol use: No    Alcohol/week: 0.0 standard drinks   Comment: Past history of Cocaine, Marijuana and LSD use   . Drug use: No  . Sexual activity: Never    Birth control/protection: Condom    Comment: given condons  Lifestyle  . Physical activity:    Days per week: Not on file    Minutes per session: Not on file  . Stress: Not on file  Relationships  . Social connections:    Talks on phone: Not on file    Gets together: Not on file    Attends religious service: Not on file    Active member of club or organization: Not on file    Attends meetings of clubs or organizations: Not on file    Relationship status: Not on file  Other Topics Concern  . Not on file  Social History Narrative  . Not on file    Labs: Lab Results  Component Value Date   HIV1RNAQUANT <20 DETECTED (A) 04/04/2018   HIV1RNAQUANT <20 NOT DETECTED 05/31/2017   HIV1RNAQUANT <20 DETECTED (A) 11/30/2016   CD4TABS 780 04/04/2018   CD4TABS 870 05/31/2017   CD4TABS 950 11/30/2016    RPR and STI Lab Results  Component Value Date   LABRPR NON-REACTIVE 04/04/2018   LABRPR NON REAC 11/30/2016   LABRPR NON REAC 04/29/2016   LABRPR NON REAC 10/29/2015   LABRPR NON  REAC 10/03/2014    No flowsheet data found.  Hepatitis B Lab Results  Component Value Date   HEPBSAB NEG 11/14/2013   HEPBSAG NEGATIVE 11/14/2013   HEPBCAB NON REACTIVE 11/14/2013   Hepatitis C No results found for: HEPCAB, HCVRNAPCRQN Hepatitis A Lab Results  Component Value Date   HAV BORDERLINE (A) 11/14/2013   Lipids: Lab Results  Component Value Date   CHOL 165 04/04/2018   TRIG 152 (H) 04/04/2018   HDL 38 (L) 04/04/2018   CHOLHDL 4.3 04/04/2018   VLDL 36 (H) 11/30/2016   LDLCALC 102 (H) 04/04/2018    Current HIV Regimen: Genvoya  Assessment: Garrett Knight is here today to follow-up for his HIV infection with Dr. Orvan Falconer. He is having some issues with his insurance and co-pays.  His insurance pays for $0 up front at the pharmacy, so he pays for it out of pocket and sends them the  bill and they reimburse him.  He uses the [place] co-pay card for 2.5 months (covers $7500), then pays for the medication out of pocket (~$3300) and they reimburse him all but ~$700.  He then pays ~$700 every month until he gets to his $5000 deductible and then they pay for the rest during the year. I offered to help set him up for co-pay relief through PA[employer] but he was adamant to leave those funds for people who truly need it.  He states he is satisfied with how this system works.  He has enough medication ordered and paid for through the end of January.  Will put a reminder to myself towards the end of January to send Biktarvy to [facility].  They can set him up with a co-pay card for a few months and he can start the cycle all over again.  Plan: - Switch to Biktarvy in January/February - Send Rx to [facility] in January   L. , PharmD, AAHIVP, CPP Infectious Diseases Clinical Pharmacist [facility] 04/18/2018, 3:52 PM

## 2018-06-14 ENCOUNTER — Other Ambulatory Visit: Payer: Self-pay | Admitting: Internal Medicine

## 2018-06-14 DIAGNOSIS — B20 Human immunodeficiency virus [HIV] disease: Secondary | ICD-10-CM

## 2018-06-14 MED ORDER — BICTEGRAVIR-EMTRICITAB-TENOFOV 50-200-25 MG PO TABS: 1.0000 | ORAL_TABLET | Freq: Every day | ORAL | 11 refills | Status: DC

## 2018-06-14 MED FILL — BIKTARVY 50-200-25 MG TABS: 50-200-25 | 30 days supply | Qty: 30 | Fill #0

## 2018-06-18 ENCOUNTER — Other Ambulatory Visit: Payer: Self-pay | Admitting: Internal Medicine

## 2018-06-18 DIAGNOSIS — B20 Human immunodeficiency virus [HIV] disease: Secondary | ICD-10-CM

## 2018-07-12 ENCOUNTER — Telehealth: Payer: Self-pay | Admitting: Pharmacy Technician

## 2018-07-12 ENCOUNTER — Encounter: Payer: Self-pay | Admitting: Pharmacy Technician

## 2018-07-12 NOTE — Telephone Encounter (Signed)
I think it was for PAF, not uninsured assistance. Kathie Rhodes will reach out.

## 2018-07-12 NOTE — Telephone Encounter (Signed)
I spoke with Garrett Knight about him wanting to get into a research project so that he does not end up paying a lot of money towards his HIV medication at the pharmacy.  He is currently insured, but his plan pays very little of the cost.  I let him know that there is another patient assistance program, [employer], [employer] that I can help him apply to for assistance.  He is agreeable to that and will let me know if needed.  I will also watch to see through [facility].  Garrett Knight. Garrett Knight CPhT Specialty Pharmacy Patient [facility] Disease Phone: [phone] Fax:  [phone]

## 2018-07-15 MED FILL — BIKTARVY 50-200-25 MG TABS: 50-200-25 | 30 days supply | Qty: 30 | Fill #1

## 2018-08-11 ENCOUNTER — Telehealth: Payer: Self-pay | Admitting: Pharmacy Technician

## 2018-08-11 MED FILL — BIKTARVY 50-200-25 MG TABS: 50-200-25 | 30 days supply | Qty: 30 | Fill #2

## 2018-08-11 NOTE — Telephone Encounter (Signed)
[facility] PATIENT ADVOCATE TELEPHONE ENCOUNTER  Garrett Knight copay card for commercial insurance has run out for 2020.  His insurance currently does not cover his HIV medication due to having a large deductible.  Once he meets the deductible, his insurance will cover 80% of the medication cost.  This plan is through his employer.  He is prepared to cover the large copay.  His income is $108,000 for 1 person in the household which means he is not eligible for assistance through PAF or [employer] due to being outside of the income requirements for assistance.  Garrett Knight. Garrett Knight CPhT Specialty Pharmacy Patient [facility] Phone: [phone] Fax:  [phone]

## 2018-08-31 ENCOUNTER — Telehealth: Payer: Self-pay

## 2018-08-31 NOTE — Telephone Encounter (Signed)
Patient walked into clinic today requesting a letter for work stating he is unable to work due to COVID-19 outbreak. Patient states that he is concerned that he is at high risk due to having HIV and asthma. Asthma is not listed in patient's problem list and is currently not following up with anyone regarding this. Informed patient that we are unable to write him off work due to his HIV. Reassured patient that as long as he is taking his medication as prescribed he is considered low risk. Also advised patient to use basic precautions, wash hands, limit close exposure to people, avoid anyone who is sick if possible.  Also advised patient to follow-up with PCP regarding asthma. Gave patient a list of primary care offices with [place] to call to get into care.  Patient did not have any further concerns at this time.  Lorenso Courier, [place]

## 2018-09-14 MED FILL — BIKTARVY 50-200-25 MG TABS: 50-200-25 | 30 days supply | Qty: 30 | Fill #3

## 2018-10-21 MED FILL — BIKTARVY 50-200-25 MG TABS: 50-200-25 | 30 days supply | Qty: 30 | Fill #4

## 2018-11-11 ENCOUNTER — Other Ambulatory Visit: Payer: Self-pay | Admitting: *Deleted

## 2018-11-11 DIAGNOSIS — B20 Human immunodeficiency virus [HIV] disease: Secondary | ICD-10-CM

## 2018-11-11 MED ORDER — BICTEGRAVIR-EMTRICITAB-TENOFOV 50-200-25 MG PO TABS: 1.0000 | ORAL_TABLET | Freq: Every day | ORAL | 5 refills | Status: DC

## 2019-03-01 NOTE — Telephone Encounter (Signed)
I would think this is more related to stress and not getting enough sleep. Not a known side effect with Biktarvy.

## 2019-04-04 ENCOUNTER — Other Ambulatory Visit: Payer: Self-pay

## 2019-04-04 DIAGNOSIS — Z113 Encounter for screening for infections with a predominantly sexual mode of transmission: Secondary | ICD-10-CM

## 2019-04-04 DIAGNOSIS — B20 Human immunodeficiency virus [HIV] disease: Secondary | ICD-10-CM

## 2019-04-05 ENCOUNTER — Other Ambulatory Visit: Payer: Self-pay

## 2019-04-05 ENCOUNTER — Other Ambulatory Visit: Payer: BLUE CROSS/BLUE SHIELD

## 2019-04-05 ENCOUNTER — Other Ambulatory Visit (HOSPITAL_COMMUNITY)
Admission: RE | Admit: 2019-04-05 | Discharge: 2019-04-05 | Disposition: A | Discharge status: Home / Self Care | Payer: BLUE CROSS/BLUE SHIELD | Source: Ambulatory Visit | Attending: Internal Medicine | Admitting: Internal Medicine

## 2019-04-05 DIAGNOSIS — Z113 Encounter for screening for infections with a predominantly sexual mode of transmission: Secondary | ICD-10-CM | POA: Insufficient documentation

## 2019-04-05 DIAGNOSIS — B20 Human immunodeficiency virus [HIV] disease: Secondary | ICD-10-CM | POA: Insufficient documentation

## 2019-04-06 LAB — T-HELPER CELL (CD4) - (RCID CLINIC ONLY)
CD4 % Helper T Cell: 37 % (ref 33–65)
CD4 T Cell Abs: 914 /uL (ref 400–1790)

## 2019-04-06 LAB — URINE CYTOLOGY ANCILLARY ONLY
Chlamydia: NEGATIVE
Comment: NEGATIVE
Comment: NORMAL
Neisseria Gonorrhea: NEGATIVE

## 2019-04-11 LAB — LIPID PANEL
Cholesterol: 178 mg/dL (ref <200)
HDL: 38 mg/dL — ABNORMAL LOW (ref >=40)
LDL Cholesterol (Calc): 116 mg/dL (calc) — ABNORMAL HIGH
Non-HDL Cholesterol (Calc): 140 mg/dL (calc) — ABNORMAL HIGH (ref <130)
Total CHOL/HDL Ratio: 4.7 (calc) (ref <5.0)
Triglycerides: 127 mg/dL (ref <150)

## 2019-04-11 LAB — COMPREHENSIVE METABOLIC PANEL
AG Ratio: 1.3 (calc) (ref 1.0–2.5)
ALT: 12 U/L (ref 9–46)
AST: 15 U/L (ref 10–35)
Albumin: 3.6 g/dL (ref 3.6–5.1)
Alkaline phosphatase (APISO): 61 U/L (ref 35–144)
BUN: 15 mg/dL (ref 7–25)
CO2: 28 mmol/L (ref 20–32)
Calcium: 9 mg/dL (ref 8.6–10.3)
Chloride: 107 mmol/L (ref 98–110)
Creat: 1.27 mg/dL (ref 0.70–1.33)
Globulin: 2.8 g/dL (calc) (ref 1.9–3.7)
Glucose, Bld: 116 mg/dL — ABNORMAL HIGH (ref 65–99)
Potassium: 3.9 mmol/L (ref 3.5–5.3)
Sodium: 142 mmol/L (ref 135–146)
Total Bilirubin: 0.4 mg/dL (ref 0.2–1.2)
Total Protein: 6.4 g/dL (ref 6.1–8.1)

## 2019-04-11 LAB — CBC
HCT: 44.3 % (ref 38.5–50.0)
Hemoglobin: 14.8 g/dL (ref 13.2–17.1)
MCH: 32.5 pg (ref 27.0–33.0)
MCHC: 33.4 g/dL (ref 32.0–36.0)
MCV: 97.1 fL (ref 80.0–100.0)
MPV: 10.2 fL (ref 7.5–12.5)
Platelets: 150 10*3/uL (ref 140–400)
RBC: 4.56 10*6/uL (ref 4.20–5.80)
RDW: 12.5 % (ref 11.0–15.0)
WBC: 6.7 10*3/uL (ref 3.8–10.8)

## 2019-04-11 LAB — HIV-1 RNA QUANT-NO REFLEX-BLD
HIV 1 RNA Quant: <20 copies/mL
HIV-1 RNA Quant, Log: <1.3 Log copies/mL

## 2019-04-11 LAB — RPR: RPR Ser Ql: NONREACTIVE

## 2019-04-19 ENCOUNTER — Telehealth: Payer: Self-pay

## 2019-04-19 NOTE — Telephone Encounter (Signed)
COVID-19 Pre-Screening Questions:04/19/19   Do you currently have a fever (>100 F), chills or unexplained body aches? NO  Are you currently experiencing new cough, shortness of breath, sore throat, runny nose?NO  .  Have you recently travelled outside the state of [place] in the last 14 days? NO .  Have you been in contact with someone that is currently pending confirmation of Covid19 testing or has been confirmed to have the [place] virus? NO  **If the patient answers NO to ALL questions -  advise the patient to please call the clinic before coming to the office should any symptoms develop.

## 2019-04-20 ENCOUNTER — Encounter: Payer: Self-pay | Admitting: Internal Medicine

## 2019-04-20 ENCOUNTER — Ambulatory Visit (INDEPENDENT_AMBULATORY_CARE_PROVIDER_SITE_OTHER): Payer: BLUE CROSS/BLUE SHIELD | Admitting: Internal Medicine

## 2019-04-20 ENCOUNTER — Other Ambulatory Visit: Payer: Self-pay

## 2019-04-20 DIAGNOSIS — B20 Human immunodeficiency virus [HIV] disease: Secondary | ICD-10-CM | POA: Diagnosis not present

## 2019-04-20 DIAGNOSIS — F17210 Nicotine dependence, cigarettes, uncomplicated: Secondary | ICD-10-CM

## 2019-04-20 DIAGNOSIS — L709 Acne, unspecified: Secondary | ICD-10-CM | POA: Insufficient documentation

## 2019-04-20 DIAGNOSIS — L708 Other acne: Secondary | ICD-10-CM | POA: Diagnosis not present

## 2019-04-20 MED ORDER — METRONIDAZOLE 0.75 % EX GEL: 1.0000 "application " | Freq: Two times a day (BID) | CUTANEOUS | 0 refills | Status: DC

## 2019-04-20 NOTE — Progress Notes (Signed)
Patient Active Problem List   Diagnosis Date Noted  . Human immunodeficiency virus (HIV) disease (HCC) 11/21/2013    Priority: High  . Acne 04/20/2019  . Genital warts 11/28/2013  . Cigarette smoker 11/28/2013  . Hypertriglyceridemia 11/28/2013    Patient's Medications  New Prescriptions   METRONIDAZOLE (METROGEL) 0.75 % GEL    Apply 1 application topically 2 (two) times daily.  Previous Medications   ALBUTEROL (VENTOLIN HFA) 108 (90 BASE) MCG/ACT INHALER    Inhale into the lungs.   BICTEGRAVIR-EMTRICITABINE-TENOFOVIR AF (BIKTARVY) 50-200-25 MG TABS TABLET    Take 1 tablet by mouth daily.   IMIQUIMOD (ALDARA) 5 % CREAM    Apply topically 3 (three) times a week.   PODOFILOX (CONDYLOX) 0.5 % GEL    Apply topically 2 (two) times daily. Apply 2 times daily for 3 days. Wait 4 days before repeating. You may repeat up to 3 times  Modified Medications   No medications on file  Discontinued Medications   No medications on file    Subjective: Garrett Knight is in for his routine HIV follow-up visit.  He has not had any problems obtaining, taking or tolerating his Biktarvy and does not recall missing doses.  He continues to smoke cigarettes and has no current plan to quit.  He is doing well other than having noticed a flare of active knee his face since he started wearing a mask during the Covid pandemic.  Review of Systems: Review of Systems  Constitutional: Negative for chills, diaphoresis, fever, malaise/fatigue and weight loss.  HENT: Negative for sore throat.   Respiratory: Negative for cough, sputum production and shortness of breath.   Cardiovascular: Negative for chest pain.  Gastrointestinal: Negative for abdominal pain, diarrhea, heartburn, nausea and vomiting.  Genitourinary: Negative for dysuria and frequency.  Musculoskeletal: Negative for joint pain and myalgias.  Skin: Negative for rash.       As noted in HPI.  Neurological: Negative for dizziness and headaches.   Psychiatric/Behavioral: Negative for depression and substance abuse. The patient is not nervous/anxious.     Past Medical History:  Diagnosis Date  . High cholesterol   . Substance abuse (HCC)     Social History   Tobacco Use  . Smoking status: Current Every Day Smoker    Packs/day: 1.00    Years: 41.00    Pack years: 41.00    Types: Cigarettes  . Smokeless tobacco: Never Used  . Tobacco comment: not ready to quit  Substance Use Topics  . Alcohol use: No    Alcohol/week: 0.0 standard drinks    Comment: Past history of Cocaine, Marijuana and LSD use   . Drug use: No    Family History  Problem Relation Age of Onset  . Cancer Sister   . Diabetes Father   . Heart disease Father   . Hypothyroidism Father   . Hypothyroidism Brother     No Known Allergies  Health Maintenance  Topic Date Due  . COLONOSCOPY  04/11/2015  . INFLUENZA VACCINE  12/31/2018  . TETANUS/TDAP  06/08/2027  . HIV Screening  Completed    Objective:  Vitals:   04/20/19 0848  BP: 114/74  Pulse: (!) 57  Temp: (!) 97.3 F (36.3 C)  TempSrc: Oral  Weight: 179 lb (81.2 kg)   Body mass index is 25.68 kg/m.  Physical Exam Constitutional:      Comments: He is his usual gruff self but in good spirits.  Cardiovascular:     Rate and Rhythm: Normal rate and regular rhythm.     Heart sounds: No murmur.  Pulmonary:     Effort: Pulmonary effort is normal.     Breath sounds: Normal breath sounds.  Abdominal:     Palpations: Abdomen is soft.     Tenderness: There is no abdominal tenderness.  Skin:    Comments: Few acne pustules under his mask his cheeks and chin.  Neurological:     General: No focal deficit present.  Psychiatric:        Mood and Affect: Mood normal.     Lab Results Lab Results  Component Value Date   WBC 6.7 04/05/2019   HGB 14.8 04/05/2019   HCT 44.3 04/05/2019   MCV 97.1 04/05/2019   PLT 150 04/05/2019    Lab Results  Component Value Date   CREATININE 1.27  04/05/2019   BUN 15 04/05/2019   NA 142 04/05/2019   K 3.9 04/05/2019   CL 107 04/05/2019   CO2 28 04/05/2019    Lab Results  Component Value Date   ALT 12 04/05/2019   AST 15 04/05/2019   ALKPHOS 63 11/30/2016   BILITOT 0.4 04/05/2019    Lab Results  Component Value Date   CHOL 178 04/05/2019   HDL 38 (L) 04/05/2019   LDLCALC 116 (H) 04/05/2019   TRIG 127 04/05/2019   CHOLHDL 4.7 04/05/2019   Lab Results  Component Value Date   LABRPR NON-REACTIVE 04/05/2019   HIV 1 RNA Quant (copies/mL)  Date Value  04/05/2019 <20 NOT DETECTED  04/04/2018 <20 DETECTED (A)  05/31/2017 <20 NOT DETECTED   CD4 T Cell Abs (/uL)  Date Value  04/05/2019 914  04/04/2018 780  05/31/2017 870     Problem List Items Addressed This Visit      High   Human immunodeficiency virus (HIV) disease ([place])    His infection remains under excellent, long-term control.  He has already received his influenza vaccine.  He will continue Biktarvy and follow-up after lab work in 1 year.      Relevant Orders   1 Year CBC   1 Year CD4   1 Year CMP   1 Year Lipid panel   1 Year RPR   1 Year VL     Unprioritized   Cigarette smoker    Him again about the importance of cigarette cessation.  He tells me that he did quit for about 1 month several years ago and felt like a "million bucks".      Acne    I will have him try metronidazole gel topically.      Relevant Medications   metroNIDAZOLE (METROGEL) 0.75 % gel        Michel Bickers, MD [facility] [place] [phone] pager   [phone] cell 04/20/2019, 9:29 AM

## 2019-04-20 NOTE — Assessment & Plan Note (Signed)
Him again about the importance of cigarette cessation.  He tells me that he did quit for about 1 month several years ago and felt like a "million bucks".

## 2019-04-20 NOTE — Assessment & Plan Note (Signed)
His infection remains under excellent, long-term control.  He has already received his influenza vaccine.  He will continue Biktarvy and follow-up after lab work in 1 year. 

## 2019-04-20 NOTE — Assessment & Plan Note (Signed)
I will have him try metronidazole gel topically.

## 2019-05-22 ENCOUNTER — Other Ambulatory Visit: Payer: Self-pay | Admitting: *Deleted

## 2019-05-22 DIAGNOSIS — B20 Human immunodeficiency virus [HIV] disease: Secondary | ICD-10-CM

## 2019-05-22 MED ORDER — BICTEGRAVIR-EMTRICITAB-TENOFOV 50-200-25 MG PO TABS: 1.0000 | ORAL_TABLET | Freq: Every day | ORAL | 5 refills | Status: DC

## 2019-06-14 ENCOUNTER — Other Ambulatory Visit: Payer: Self-pay | Admitting: *Deleted

## 2019-06-14 DIAGNOSIS — B20 Human immunodeficiency virus [HIV] disease: Secondary | ICD-10-CM

## 2019-06-14 MED ORDER — BICTEGRAVIR-EMTRICITAB-TENOFOV 50-200-25 MG PO TABS: 1.0000 | ORAL_TABLET | Freq: Every day | ORAL | 5 refills | Status: DC

## 2019-12-21 ENCOUNTER — Other Ambulatory Visit: Payer: Self-pay | Admitting: Internal Medicine

## 2019-12-21 DIAGNOSIS — B20 Human immunodeficiency virus [HIV] disease: Secondary | ICD-10-CM

## 2020-02-01 ENCOUNTER — Other Ambulatory Visit: Payer: Self-pay

## 2020-02-01 ENCOUNTER — Encounter: Payer: Self-pay | Admitting: Internal Medicine

## 2020-02-01 ENCOUNTER — Ambulatory Visit (INDEPENDENT_AMBULATORY_CARE_PROVIDER_SITE_OTHER): Payer: BLUE CROSS/BLUE SHIELD | Admitting: Internal Medicine

## 2020-02-01 DIAGNOSIS — B20 Human immunodeficiency virus [HIV] disease: Secondary | ICD-10-CM | POA: Diagnosis not present

## 2020-02-01 DIAGNOSIS — F17210 Nicotine dependence, cigarettes, uncomplicated: Secondary | ICD-10-CM | POA: Diagnosis not present

## 2020-02-01 DIAGNOSIS — Z79899 Other long term (current) drug therapy: Secondary | ICD-10-CM | POA: Diagnosis not present

## 2020-02-01 DIAGNOSIS — A519 Early syphilis, unspecified: Secondary | ICD-10-CM

## 2020-02-01 MED ORDER — PENICILLIN G BENZATHINE 1200000 UNIT/2ML IM SUSP
1.2000 10*6.[IU] | Freq: Once | INTRAMUSCULAR | Status: AC
  Administered 2020-02-01: 1.2 10*6.[IU] via INTRAMUSCULAR

## 2020-02-01 NOTE — Addendum Note (Signed)
Addended by: Linna Hoff D on: 02/01/2020 11:46 AM   Modules accepted: Orders

## 2020-02-01 NOTE — Progress Notes (Signed)
Per verbal order from Dr. Orvan Falconer, patient treated with 2.4 million units Bicillin. Patient thinks he might have had a reaction to penicillin once as a child, but per Dr. Orvan Falconer has tolerated derivatives without issue since. Patient observed for 20 minutes after injection. Reports no issues. Patient instructed to call with any questions, verbalizes understanding. . Patient walked out to lobby where his friend was waiting for him. Jason at [facility] notified. 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  Sandie Ano, RN

## 2020-02-01 NOTE — Assessment & Plan Note (Signed)
I encouraged him to try to cut down and quit smoking.  I also talked to him at length about the importance of vaccination against COVID-19.

## 2020-02-01 NOTE — Progress Notes (Signed)
Patient Active Problem List   Diagnosis Date Noted  . Human immunodeficiency virus (HIV) disease (HCC) 11/21/2013    Priority: High  . 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 Acne 04/20/2019  . Genital warts 11/28/2013  . Cigarette smoker 11/28/2013  . Hypertriglyceridemia 11/28/2013    Patient's Medications  New Prescriptions   No medications on file  Previous Medications   ALBUTEROL (VENTOLIN HFA) 108 (90 BASE) MCG/ACT INHALER    Inhale into the lungs.   BIKTARVY 50-200-25 MG TABS TABLET    TAKE 1 TABLET BY MOUTH DAILY   IMIQUIMOD (ALDARA) 5 % CREAM    Apply topically 3 (three) times a week.   METRONIDAZOLE (METROGEL) 0.75 % GEL    Apply 1 application topically 2 (two) times daily.   PODOFILOX (CONDYLOX) 0.5 % GEL    Apply topically 2 (two) times daily. Apply 2 times daily for 3 days. Wait 4 days before repeating. You may repeat up to 3 times  Modified Medications   No medications on file  Discontinued Medications   DOXYCYCLINE (VIBRA-TABS) 100 MG TABLET    Take 100 mg by mouth 2 (two) times daily.    Subjective: Garrett Knight is seen on a working basis.  He has not had any problems obtaining, taking or tolerating his Biktarvy and does not think he has missed any doses.  He got bored and tired of his job out of town and quit 2 months ago.  He has been relaxing but knows he will need to look for work soon.  Several weeks ago he developed a red rash on his trunk and upper legs.  He went to an urgent care center and was told that he needed to be tested for syphilis.  He was given a 7-day course of doxycycline and the rash resolved.  He said that he felt hot but did not take his temperature.   He took a phone call during our exam.  He was called by the urgent care clinic that he went to recently and was told that his syphilis test was positive.  He tells me that he has had 3 new partners in the past year.  He is still smoking cigarettes and has no current plan to quit.  He has not taken a  Covid vaccine.  He says that he has heard that the vaccine can kill people.  Review of Systems: Review of Systems  Constitutional: Positive for fever. Negative for chills and diaphoresis.  Respiratory: Positive for cough. Negative for sputum production and shortness of breath.   Cardiovascular: Negative for chest pain.  Skin: Positive for rash.    Past Medical History:  Diagnosis Date  . High cholesterol   . Substance abuse (HCC)     Social History   Tobacco Use  . Smoking status: Current Every Day Smoker    Packs/day: 1.00    Years: 41.00    Pack years: 41.00    Types: Cigarettes  . Smokeless tobacco: Never Used  . Tobacco comment: not ready to quit  Substance Use Topics  . Alcohol use: No    Alcohol/week: 0.0 standard drinks    Comment: Past history of Cocaine, Marijuana and LSD use   . Drug use: No    Family History  Problem Relation Age of Onset  . Cancer Sister   . Diabetes Father   . Heart disease Father   . Hypothyroidism Father   . Hypothyroidism Brother  No Known Allergies  Health Maintenance  Topic Date Due  . COVID-19 Vaccine (1) Never done  . COLONOSCOPY  Never done  . INFLUENZA VACCINE  12/31/2019  . TETANUS/TDAP  06/08/2027  . Hepatitis C Screening  Completed  . HIV Screening  Completed    Objective:  Vitals:   02/01/20 1013  BP: 118/74  Pulse: (!) 50  Temp: 97.7 F (36.5 C)  TempSrc: Oral  SpO2: 100%  Weight: 167 lb (75.8 kg)  Height: 5\' 9"  (1.753 m)   Body mass index is 24.66 kg/m.  Physical Exam Constitutional:      Comments: He is in good spirits.  Cardiovascular:     Rate and Rhythm: Normal rate and regular rhythm.     Heart sounds: No murmur heard.   Pulmonary:     Effort: Pulmonary effort is normal.     Breath sounds: Normal breath sounds.  Abdominal:     Palpations: Abdomen is soft.     Tenderness: There is no abdominal tenderness.  Musculoskeletal:        General: No swelling or tenderness.  Skin:     Findings: No rash.  Neurological:     General: No focal deficit present.  Psychiatric:        Mood and Affect: Mood normal.     Lab Results Lab Results  Component Value Date   WBC 6.7 04/05/2019   HGB 14.8 04/05/2019   HCT 44.3 04/05/2019   MCV 97.1 04/05/2019   PLT 150 04/05/2019    Lab Results  Component Value Date   CREATININE 1.27 04/05/2019   BUN 15 04/05/2019   NA 142 04/05/2019   K 3.9 04/05/2019   CL 107 04/05/2019   CO2 28 04/05/2019    Lab Results  Component Value Date   ALT 12 04/05/2019   AST 15 04/05/2019   ALKPHOS 63 11/30/2016   BILITOT 0.4 04/05/2019    Lab Results  Component Value Date   CHOL 178 04/05/2019   HDL 38 (L) 04/05/2019   LDLCALC 116 (H) 04/05/2019   TRIG 127 04/05/2019   CHOLHDL 4.7 04/05/2019   Lab Results  Component Value Date   LABRPR NON-REACTIVE 04/05/2019   HIV 1 RNA Quant (copies/mL)  Date Value  04/05/2019 <20 NOT DETECTED  04/04/2018 <20 DETECTED (A)  05/31/2017 <20 NOT DETECTED   CD4 T Cell Abs (/uL)  Date Value  04/05/2019 914  04/04/2018 780  05/31/2017 870     Problem List Items Addressed This Visit      High   Human immunodeficiency virus (HIV) disease (HCC)    His infection has been under very good long-term control.  He will get blood work today, continue 06/02/2017 and follow-up in 2 months.      Relevant Orders   T-helper cell (CD4)- ([facility] clinic only)   HIV-1 RNA quant-no reflex-bld   CBC   Comprehensive metabolic panel   RPR   Lipid panel     Unprioritized   Early syphilis    I will check his RPR here today and treat him with benzathine penicillin 2,400,000 units IM.      Cigarette smoker    I encouraged him to try to cut down and quit smoking.  I also talked to him at length about the importance of vaccination against COVID-19.           Radio producer, MD [facility] for Infectious Disease [facility] [phone] pager   [phone]  cell 02/01/2020, 11:24 AM

## 2020-02-01 NOTE — Assessment & Plan Note (Signed)
His infection has been under very good long-term control.  He will get blood work today, continue Radio producer and follow-up in 2 months.

## 2020-02-01 NOTE — Assessment & Plan Note (Signed)
I will check his RPR here today and treat him with benzathine penicillin 2,400,000 units IM.

## 2020-02-02 LAB — T-HELPER CELL (CD4) - (RCID CLINIC ONLY)
CD4 % Helper T Cell: 32 % — ABNORMAL LOW (ref 33–65)
CD4 T Cell Abs: 1002 /uL (ref 400–1790)

## 2020-02-06 LAB — CBC
HCT: 40.3 % (ref 38.5–50.0)
Hemoglobin: 13.9 g/dL (ref 13.2–17.1)
MCH: 32.4 pg (ref 27.0–33.0)
MCHC: 34.5 g/dL (ref 32.0–36.0)
MCV: 93.9 fL (ref 80.0–100.0)
MPV: 9.1 fL (ref 7.5–12.5)
Platelets: 240 10*3/uL (ref 140–400)
RBC: 4.29 10*6/uL (ref 4.20–5.80)
RDW: 12.8 % (ref 11.0–15.0)
WBC: 7.7 10*3/uL (ref 3.8–10.8)

## 2020-02-06 LAB — COMPREHENSIVE METABOLIC PANEL
AG Ratio: 1 (calc) (ref 1.0–2.5)
ALT: 16 U/L (ref 9–46)
AST: 18 U/L (ref 10–35)
Albumin: 3.6 g/dL (ref 3.6–5.1)
Alkaline phosphatase (APISO): 76 U/L (ref 35–144)
BUN: 14 mg/dL (ref 7–25)
CO2: 29 mmol/L (ref 20–32)
Calcium: 8.9 mg/dL (ref 8.6–10.3)
Chloride: 106 mmol/L (ref 98–110)
Creat: 1.14 mg/dL (ref 0.70–1.33)
Globulin: 3.7 g/dL (calc) (ref 1.9–3.7)
Glucose, Bld: 85 mg/dL (ref 65–99)
Potassium: 4.3 mmol/L (ref 3.5–5.3)
Sodium: 139 mmol/L (ref 135–146)
Total Bilirubin: 0.5 mg/dL (ref 0.2–1.2)
Total Protein: 7.3 g/dL (ref 6.1–8.1)

## 2020-02-06 LAB — FLUORESCENT TREPONEMAL AB(FTA)-IGG-BLD: Fluorescent Treponemal ABS: REACTIVE — AB

## 2020-02-06 LAB — RPR: RPR Ser Ql: REACTIVE — AB

## 2020-02-06 LAB — LIPID PANEL
Cholesterol: 174 mg/dL (ref <200)
HDL: 31 mg/dL — ABNORMAL LOW (ref >=40)
LDL Cholesterol (Calc): 118 mg/dL (calc) — ABNORMAL HIGH
Non-HDL Cholesterol (Calc): 143 mg/dL (calc) — ABNORMAL HIGH (ref <130)
Total CHOL/HDL Ratio: 5.6 (calc) — ABNORMAL HIGH (ref <5.0)
Triglycerides: 139 mg/dL (ref <150)

## 2020-02-06 LAB — HIV-1 RNA QUANT-NO REFLEX-BLD
HIV 1 RNA Quant: <20 Copies/mL
HIV-1 RNA Quant, Log: <1.3 Log cps/mL

## 2020-02-06 LAB — RPR TITER: RPR Titer: 1:32 {titer} — ABNORMAL HIGH

## 2020-04-01 ENCOUNTER — Other Ambulatory Visit: Payer: BLUE CROSS/BLUE SHIELD

## 2020-04-15 ENCOUNTER — Encounter: Payer: BLUE CROSS/BLUE SHIELD | Admitting: Internal Medicine

## 2020-04-16 ENCOUNTER — Encounter: Payer: BLUE CROSS/BLUE SHIELD | Admitting: Internal Medicine

## 2020-05-15 ENCOUNTER — Other Ambulatory Visit: Payer: Self-pay

## 2020-05-15 ENCOUNTER — Ambulatory Visit (INDEPENDENT_AMBULATORY_CARE_PROVIDER_SITE_OTHER): Payer: BLUE CROSS/BLUE SHIELD | Admitting: Internal Medicine

## 2020-05-15 ENCOUNTER — Encounter: Payer: Self-pay | Admitting: Internal Medicine

## 2020-05-15 ENCOUNTER — Telehealth: Payer: Self-pay

## 2020-05-15 DIAGNOSIS — A519 Early syphilis, unspecified: Secondary | ICD-10-CM | POA: Diagnosis not present

## 2020-05-15 DIAGNOSIS — B20 Human immunodeficiency virus [HIV] disease: Secondary | ICD-10-CM | POA: Diagnosis not present

## 2020-05-15 DIAGNOSIS — F17210 Nicotine dependence, cigarettes, uncomplicated: Secondary | ICD-10-CM

## 2020-05-15 MED ORDER — BIKTARVY 50-200-25 MG PO TABS: 1.0000 | ORAL_TABLET | Freq: Every day | ORAL | 11 refills | Status: DC

## 2020-05-15 NOTE — Assessment & Plan Note (Signed)
Underwent treatment for early syphilis 3 months ago.  I talked to him again about the utmost importance of reducing the number of sexual partners he has been choosing his partners very carefully.  He became upset when I suggested that we repeat blood work today.  He agreed to come back in 3 months for repeat RPR.

## 2020-05-15 NOTE — Telephone Encounter (Signed)
[facility] Patient Advocate Encounter   Was successful in obtaining a [employer] copay card for Biktarvy.  This copay card will make the patients copay $0.00.  I have spoken with the patient.         Emrey Thornley, CPhT [facility] Phone: [phone] Fax:  [phone]  

## 2020-05-15 NOTE — Assessment & Plan Note (Signed)
His infection remains under excellent, long-term controlled.  I had him meet with our pharmacy staff today to see if we can help with his co-pays.  He will follow-up in 3 months.

## 2020-05-15 NOTE — Progress Notes (Signed)
Patient Active Problem List   Diagnosis Date Noted  . Human immunodeficiency virus (HIV) disease (HCC) 11/21/2013    Priority: High  . 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 Acne 04/20/2019  . Genital warts 11/28/2013  . Cigarette smoker 11/28/2013  . Hypertriglyceridemia 11/28/2013    Patient's Medications  New Prescriptions   No medications on file  Previous Medications   ALBUTEROL (VENTOLIN HFA) 108 (90 BASE) MCG/ACT INHALER    Inhale into the lungs.   IMIQUIMOD (ALDARA) 5 % CREAM    Apply topically 3 (three) times a week.   METRONIDAZOLE (METROGEL) 0.75 % GEL    Apply 1 application topically 2 (two) times daily.   PODOFILOX (CONDYLOX) 0.5 % GEL    Apply topically 2 (two) times daily. Apply 2 times daily for 3 days. Wait 4 days before repeating. You may repeat up to 3 times  Modified Medications   Modified Medication Previous Medication   BICTEGRAVIR-EMTRICITABINE-TENOFOVIR AF (BIKTARVY) 50-200-25 MG TABS TABLET BIKTARVY 50-200-25 MG TABS tablet      Take 1 tablet by mouth daily.    TAKE 1 TABLET BY MOUTH DAILY  Discontinued Medications   No medications on file    Subjective: Garrett Knight is in for his routine HIV follow-up visit.  He denies any problems obtaining, taking or tolerating his Biktarvy other than having to pay a $30 co-pay each month even though he has a co-pay card.  Does not think she has missed any doses.  He had a 30-day job working at a [employer] in [place] but has been laid off again.  He says that he has been sexually active since his last visit.  He seems very surprised that I ask.  He seems to be unaware of the potential dangers of other sexually transmitted diseases and resistant strains of HIV.  He says that he thinks he has gotten a flu shot.  He adamantly refuses Covid vaccination.  He continues to smoke cigarettes.  Review of Systems: Review of Systems  Constitutional: Negative for fever, malaise/fatigue and weight loss.  Respiratory:  Negative for cough and shortness of breath.   Cardiovascular: Negative for chest pain.  Gastrointestinal: Negative for abdominal pain, diarrhea, nausea and vomiting.  Genitourinary: Negative for dysuria.  Psychiatric/Behavioral: Negative for depression. The patient is not nervous/anxious.     Past Medical History:  Diagnosis Date  . High cholesterol   . Substance abuse (HCC)     Social History   Tobacco Use  . Smoking status: Current Every Day Smoker    Packs/day: 1.00    Years: 41.00    Pack years: 41.00    Types: Cigarettes  . Smokeless tobacco: Never Used  . Tobacco comment: not ready to quit  Substance Use Topics  . Alcohol use: No    Alcohol/week: 0.0 standard drinks    Comment: Past history of Cocaine, Marijuana and LSD use   . Drug use: No    Family History  Problem Relation Age of Onset  . Cancer Sister   . Diabetes Father   . Heart disease Father   . Hypothyroidism Father   . Hypothyroidism Brother     No Known Allergies  Health Maintenance  Topic Date Due  . COVID-19 Vaccine (1) Never done  . COLONOSCOPY  Never done  . INFLUENZA VACCINE  12/31/2019  . TETANUS/TDAP  06/08/2027  . Hepatitis C Screening  Completed  . HIV Screening  Completed  Objective:  Vitals:   05/15/20 0955  BP: 119/75  Pulse: (!) 51  Temp: 98 F (36.7 C)  TempSrc: Oral  SpO2: 100%  Weight: 181 lb (82.1 kg)  Height: 5\' 10"  (1.778 m)   Body mass index is 25.97 kg/m.  Physical Exam Constitutional:      Comments: He is much more anxious and slightly angry today.  He also seems very distracted.  Had to repeat almost everything I spoke to him about today multiple times.  Cardiovascular:     Rate and Rhythm: Normal rate.  Pulmonary:     Effort: Pulmonary effort is normal.  Abdominal:     Palpations: Abdomen is soft.     Tenderness: There is no abdominal tenderness.  Musculoskeletal:        General: No swelling or tenderness.  Skin:    Findings: No rash.      Lab Results Lab Results  Component Value Date   WBC 7.7 02/01/2020   HGB 13.9 02/01/2020   HCT 40.3 02/01/2020   MCV 93.9 02/01/2020   PLT 240 02/01/2020    Lab Results  Component Value Date   CREATININE 1.14 02/01/2020   BUN 14 02/01/2020   NA 139 02/01/2020   K 4.3 02/01/2020   CL 106 02/01/2020   CO2 29 02/01/2020    Lab Results  Component Value Date   ALT 16 02/01/2020   AST 18 02/01/2020   ALKPHOS 63 11/30/2016   BILITOT 0.5 02/01/2020    Lab Results  Component Value Date   CHOL 174 02/01/2020   HDL 31 (L) 02/01/2020   LDLCALC 118 (H) 02/01/2020   TRIG 139 02/01/2020   CHOLHDL 5.6 (H) 02/01/2020   Lab Results  Component Value Date   LABRPR REACTIVE (A) 02/01/2020   RPRTITER 1:32 (H) 02/01/2020   HIV 1 RNA Quant  Date Value  02/01/2020 <20 Copies/mL  04/05/2019 <20 NOT DETECTED copies/mL  04/04/2018 <20 DETECTED copies/mL (A)   CD4 T Cell Abs (/uL)  Date Value  02/01/2020 1,002  04/05/2019 914  04/04/2018 780     Problem List Items Addressed This Visit      High   Human immunodeficiency virus (HIV) disease (HCC)    His infection remains under excellent, long-term controlled.  I had him meet with our pharmacy staff today to see if we can help with his co-pays.  He will follow-up in 3 months.      Relevant Medications   bictegravir-emtricitabine-tenofovir AF (BIKTARVY) 50-200-25 MG TABS tablet     Unprioritized   Cigarette smoker    He is not ready to consider quitting.      Early syphilis    Underwent treatment for early syphilis 3 months ago.  I talked to him again about the utmost importance of reducing the number of sexual partners he has been choosing his partners very carefully.  He became upset when I suggested that we repeat blood work today.  He agreed to come back in 3 months for repeat RPR.      Relevant Medications   bictegravir-emtricitabine-tenofovir AF (BIKTARVY) 50-200-25 MG TABS tablet        13/08/2017,  MD [facility] for Infectious Disease [facility] [phone] pager   [phone] cell 05/15/2020, 10:20 AM

## 2020-05-15 NOTE — Assessment & Plan Note (Signed)
He is not ready to consider quitting. 

## 2020-06-13 ENCOUNTER — Other Ambulatory Visit: Payer: Self-pay | Admitting: Internal Medicine

## 2020-06-13 DIAGNOSIS — B20 Human immunodeficiency virus [HIV] disease: Secondary | ICD-10-CM

## 2020-08-14 ENCOUNTER — Ambulatory Visit: Payer: BLUE CROSS/BLUE SHIELD | Admitting: Internal Medicine

## 2020-08-27 NOTE — Telephone Encounter (Signed)
I called patient and left Vm to make him a financial appt to see about signing up for ADAP. I will reach out to the patient this week to schedule that appt.

## 2020-08-29 ENCOUNTER — Encounter: Payer: Self-pay | Admitting: Internal Medicine

## 2020-09-02 ENCOUNTER — Other Ambulatory Visit (HOSPITAL_COMMUNITY): Payer: Self-pay

## 2020-09-06 ENCOUNTER — Other Ambulatory Visit (HOSPITAL_COMMUNITY): Payer: Self-pay

## 2020-10-14 ENCOUNTER — Other Ambulatory Visit: Payer: Self-pay

## 2020-10-14 DIAGNOSIS — B20 Human immunodeficiency virus [HIV] disease: Secondary | ICD-10-CM

## 2020-10-14 MED ORDER — BIKTARVY 50-200-25 MG PO TABS: 1.0000 | ORAL_TABLET | Freq: Every day | ORAL | 3 refills | Status: DC

## 2020-10-17 ENCOUNTER — Ambulatory Visit (INDEPENDENT_AMBULATORY_CARE_PROVIDER_SITE_OTHER): Payer: 59 | Admitting: Internal Medicine

## 2020-10-17 ENCOUNTER — Encounter: Payer: Self-pay | Admitting: Internal Medicine

## 2020-10-17 ENCOUNTER — Other Ambulatory Visit: Payer: Self-pay

## 2020-10-17 DIAGNOSIS — B20 Human immunodeficiency virus [HIV] disease: Secondary | ICD-10-CM | POA: Diagnosis not present

## 2020-10-17 DIAGNOSIS — A519 Early syphilis, unspecified: Secondary | ICD-10-CM

## 2020-10-17 DIAGNOSIS — F32A Depression, unspecified: Secondary | ICD-10-CM | POA: Diagnosis not present

## 2020-10-17 DIAGNOSIS — F17210 Nicotine dependence, cigarettes, uncomplicated: Secondary | ICD-10-CM

## 2020-10-17 HISTORY — DX: Depression, unspecified: F32.A

## 2020-10-17 NOTE — Assessment & Plan Note (Signed)
He will get a repeat RPR today. 

## 2020-10-17 NOTE — Progress Notes (Signed)
Patient Active Problem List   Diagnosis Date Noted  . Human immunodeficiency virus (HIV) disease (HCC) 11/21/2013    Priority: High  . Depression 10/17/2020  . 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 Acne 04/20/2019  . Genital warts 11/28/2013  . Cigarette smoker 11/28/2013  . Hypertriglyceridemia 11/28/2013    Patient's Medications  New Prescriptions   No medications on file  Previous Medications   ALBUTEROL (VENTOLIN HFA) 108 (90 BASE) MCG/ACT INHALER    Inhale into the lungs.   BICTEGRAVIR-EMTRICITABINE-TENOFOVIR AF (BIKTARVY) 50-200-25 MG TABS TABLET    Take 1 tablet by mouth daily.   IMIQUIMOD (ALDARA) 5 % CREAM    Apply topically 3 (three) times a week.   METRONIDAZOLE (METROGEL) 0.75 % GEL    Apply 1 application topically 2 (two) times daily.   PODOFILOX (CONDYLOX) 0.5 % GEL    Apply topically 2 (two) times daily. Apply 2 times daily for 3 days. Wait 4 days before repeating. You may repeat up to 3 times  Modified Medications   No medications on file  Discontinued Medications   No medications on file    Subjective: Garrett Knight is in for his routine HIV follow-up visit.  He denies having any problems obtaining, taking or tolerating Biktarvy and says that he never misses doses.  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  He says that President Biden increase the cost of his Biktarvy to $4000 per month.  He says that he does not have to pay anything to get it.  He says that he has not been feeling well and that his children and coworkers are worried about him.  He says that he knows what he wants to say but he has trouble finding the words to say it.  He is not sleeping well.  In particular he has difficulty falling asleep.  He is more stressed and depressed.  He points out all of the bad things that are going on in the world today.  He feels like things were much better under President Trump and feels like President Jackquline Bosch is not doing what he  should do to address inflation.  He mentions the price of gas and the lack of baby formula.  He recalls being ill for a short period of time in December 2019.  He believes that he had COVID infection even before COVID was recognized here in [place].  He has not taken a COVID-vaccine.  He continues to smoke cigarettes.  He is currently looking for work.  Review of Systems: Review of Systems  Constitutional: Negative for fever and weight loss.  Respiratory: Negative for cough and shortness of breath.   Cardiovascular: Negative for chest pain.  Gastrointestinal: Negative for abdominal pain, diarrhea, nausea and vomiting.  Neurological: Positive for speech change. Negative for focal weakness and headaches.  Psychiatric/Behavioral: Positive for depression. The patient has insomnia.     Past Medical History:  Diagnosis Date  . Depression 10/17/2020  . High cholesterol   . Substance abuse (HCC)     Social History   Tobacco Use  . Smoking status: Current Every Day Smoker    Packs/day: 1.00    Years: 41.00    Pack years: 41.00    Types: Cigarettes  . Smokeless tobacco: Never Used  . Tobacco comment: not ready to quit  Substance Use Topics  . Alcohol use: No    Alcohol/week: 0.0 standard drinks  Comment: Past history of Cocaine, Marijuana and LSD use   . Drug use: No    Family History  Problem Relation Age of Onset  . Cancer Sister   . Diabetes Father   . Heart disease Father   . Hypothyroidism Father   . Hypothyroidism Brother     No Known Allergies  Health Maintenance  Topic Date Due  . COVID-19 Vaccine (1) Never done  . COLONOSCOPY (Pts 45-62yrs Insurance coverage will need to be confirmed)  Never done  . INFLUENZA VACCINE  12/30/2020  . TETANUS/TDAP  06/08/2027  . Hepatitis C Screening  Completed  . HIV Screening  Completed  . HPV VACCINES  Aged Out    Objective:  Vitals:   10/17/20 0948  BP: 111/73  Pulse: (!) 59  Temp: 97.9 F (36.6 C)  TempSrc:  Oral  SpO2: 99%  Weight: 174 lb (78.9 kg)   Body mass index is 24.97 kg/m.  Physical Exam Constitutional:      Comments: He is very talkative.  He is slightly agitated when talking about President Jackquline Bosch and all this going on in the world.  He appears to be worried about his health.  Cardiovascular:     Rate and Rhythm: Normal rate and regular rhythm.     Heart sounds: No murmur heard.   Pulmonary:     Effort: Pulmonary effort is normal.     Breath sounds: Normal breath sounds.  Abdominal:     Palpations: Abdomen is soft.     Tenderness: There is no abdominal tenderness.  Skin:    Findings: No rash.  Neurological:     General: No focal deficit present.     Cranial Nerves: No cranial nerve deficit.     Motor: No weakness.     Gait: Gait normal.     Comments: His speech is normal.     Lab Results Lab Results  Component Value Date   WBC 7.7 02/01/2020   HGB 13.9 02/01/2020   HCT 40.3 02/01/2020   MCV 93.9 02/01/2020   PLT 240 02/01/2020    Lab Results  Component Value Date   CREATININE 1.14 02/01/2020   BUN 14 02/01/2020   NA 139 02/01/2020   K 4.3 02/01/2020   CL 106 02/01/2020   CO2 29 02/01/2020    Lab Results  Component Value Date   ALT 16 02/01/2020   AST 18 02/01/2020   ALKPHOS 63 11/30/2016   BILITOT 0.5 02/01/2020    Lab Results  Component Value Date   CHOL 174 02/01/2020   HDL 31 (L) 02/01/2020   LDLCALC 118 (H) 02/01/2020   TRIG 139 02/01/2020   CHOLHDL 5.6 (H) 02/01/2020   Lab Results  Component Value Date   LABRPR REACTIVE (A) 02/01/2020   RPRTITER 1:32 (H) 02/01/2020   HIV 1 RNA Quant  Date Value  02/01/2020 <20 Copies/mL  04/05/2019 <20 NOT DETECTED copies/mL  04/04/2018 <20 DETECTED copies/mL (A)   CD4 T Cell Abs (/uL)  Date Value  02/01/2020 1,002  04/05/2019 914  04/04/2018 780     Problem List Items Addressed This Visit      High   Human immunodeficiency virus (HIV) disease (HCC)    His infection remains under  excellent long-term control.  He will get repeat blood work today, continue Radio producer and have a follow-up phone visit in 4 weeks.      Relevant Orders   T-helper cell (CD4)- ([facility] clinic only)   HIV-1 RNA quant-no reflex-bld  CBC   Comprehensive metabolic panel   RPR   Lipid panel     Unprioritized   Cigarette smoker    He is currently not ready to consider quitting.      Early syphilis    He will get a repeat RPR today.      Depression        Cliffton Asters, MD [facility] [phone] pager   [phone] cell 10/17/2020, 11:26 AM

## 2020-10-17 NOTE — Assessment & Plan Note (Signed)
His infection remains under excellent long-term control.  He will get repeat blood work today, continue Radio producer and have a follow-up phone visit in 4 weeks.

## 2020-10-17 NOTE — Assessment & Plan Note (Signed)
He is currently not ready to consider quitting.

## 2020-10-18 LAB — T-HELPER CELL (CD4) - (RCID CLINIC ONLY)
CD4 % Helper T Cell: 33 % (ref 33–65)
CD4 T Cell Abs: 941 /uL (ref 400–1790)

## 2020-10-21 LAB — COMPREHENSIVE METABOLIC PANEL
AG Ratio: 1.3 (calc) (ref 1.0–2.5)
ALT: 17 U/L (ref 9–46)
AST: 19 U/L (ref 10–35)
Albumin: 3.9 g/dL (ref 3.6–5.1)
Alkaline phosphatase (APISO): 66 U/L (ref 35–144)
BUN: 11 mg/dL (ref 7–25)
CO2: 30 mmol/L (ref 20–32)
Calcium: 9.3 mg/dL (ref 8.6–10.3)
Chloride: 104 mmol/L (ref 98–110)
Creat: 1.15 mg/dL (ref 0.70–1.33)
Globulin: 3.1 g/dL (calc) (ref 1.9–3.7)
Glucose, Bld: 85 mg/dL (ref 65–99)
Potassium: 4.1 mmol/L (ref 3.5–5.3)
Sodium: 139 mmol/L (ref 135–146)
Total Bilirubin: 0.6 mg/dL (ref 0.2–1.2)
Total Protein: 7 g/dL (ref 6.1–8.1)

## 2020-10-21 LAB — CBC
HCT: 43.8 % (ref 38.5–50.0)
Hemoglobin: 15.2 g/dL (ref 13.2–17.1)
MCH: 32.7 pg (ref 27.0–33.0)
MCHC: 34.7 g/dL (ref 32.0–36.0)
MCV: 94.2 fL (ref 80.0–100.0)
MPV: 9.9 fL (ref 7.5–12.5)
Platelets: 153 10*3/uL (ref 140–400)
RBC: 4.65 10*6/uL (ref 4.20–5.80)
RDW: 12.7 % (ref 11.0–15.0)
WBC: 6.9 10*3/uL (ref 3.8–10.8)

## 2020-10-21 LAB — LIPID PANEL
Cholesterol: 183 mg/dL (ref <200)
HDL: 38 mg/dL — ABNORMAL LOW (ref >=40)
LDL Cholesterol (Calc): 122 mg/dL (calc) — ABNORMAL HIGH
Non-HDL Cholesterol (Calc): 145 mg/dL (calc) — ABNORMAL HIGH (ref <130)
Total CHOL/HDL Ratio: 4.8 (calc) (ref <5.0)
Triglycerides: 118 mg/dL (ref <150)

## 2020-10-21 LAB — FLUORESCENT TREPONEMAL AB(FTA)-IGG-BLD: Fluorescent Treponemal ABS: REACTIVE — AB

## 2020-10-21 LAB — RPR TITER: RPR Titer: 1:2 {titer} — ABNORMAL HIGH

## 2020-10-21 LAB — RPR: RPR Ser Ql: REACTIVE — AB

## 2020-10-21 LAB — HIV-1 RNA QUANT-NO REFLEX-BLD
HIV 1 RNA Quant: NOT DETECTED Copies/mL
HIV-1 RNA Quant, Log: NOT DETECTED Log cps/mL

## 2020-11-14 ENCOUNTER — Telehealth (INDEPENDENT_AMBULATORY_CARE_PROVIDER_SITE_OTHER): Payer: 59 | Admitting: Internal Medicine

## 2020-11-14 ENCOUNTER — Other Ambulatory Visit: Payer: Self-pay

## 2020-11-14 DIAGNOSIS — B20 Human immunodeficiency virus [HIV] disease: Secondary | ICD-10-CM | POA: Diagnosis not present

## 2020-11-14 MED ORDER — BIKTARVY 50-200-25 MG PO TABS: 1.0000 | ORAL_TABLET | Freq: Every day | ORAL | 11 refills | Status: DC

## 2020-11-14 NOTE — Progress Notes (Signed)
Virtual Visit via Telephone Note  I connected with Lewie Deman on 11/14/20 at  9:00 AM EDT by telephone and verified that I am speaking with the correct person using two identifiers.  Location: Patient: Home Provider: [facility]   I discussed the limitations, risks, security and privacy concerns of performing an evaluation and management service by telephone and the availability of in person appointments. I also discussed with the patient that there may be a patient responsible charge related to this service. The patient expressed understanding and agreed to proceed.   History of Present Illness: I spoke to Casimiro Needle by phone today.  He has had no problems obtaining or taking his Biktarvy.  He says that he is feeling better and has not noticed any problems choosing his words in the past few weeks.  He is still looking for work.   Observations/Objective: 10/17/2020 HIV 1 RNA Quant  Date Value  10/17/2020 Not Detected Copies/mL  02/01/2020 <20 Copies/mL  04/05/2019 <20 NOT DETECTED copies/mL   CD4 T Cell Abs (/uL)  Date Value  10/17/2020 941  02/01/2020 1,002  04/05/2019 914    RPR down to low 1:2  Assessment and Plan: His HIV infection remains under excellent, long-term control.  He will continue Biktarvy and follow-up in 6 months.  He has had an excellent response to recent treatment for syphilis.  He does not seem as stressed as he was at the time of his last visit and he no longer has concerns about his speech.  Follow Up Instructions: Continue Biktarvy Follow-up for repeat blood work in 6 months   I discussed the assessment and treatment plan with the patient. The patient was provided an opportunity to ask questions and all were answered. The patient agreed with the plan and demonstrated an understanding of the instructions.   The patient was advised to call back or seek an in-person evaluation if the symptoms worsen or if the condition fails to improve as anticipated.  I  provided 15 minutes of non-face-to-face time during this encounter.   Cliffton Asters, MD

## 2021-02-10 ENCOUNTER — Other Ambulatory Visit: Payer: Self-pay | Admitting: Internal Medicine

## 2021-02-10 DIAGNOSIS — B20 Human immunodeficiency virus [HIV] disease: Secondary | ICD-10-CM

## 2021-04-17 ENCOUNTER — Ambulatory Visit (INDEPENDENT_AMBULATORY_CARE_PROVIDER_SITE_OTHER): Payer: 59 | Admitting: Internal Medicine

## 2021-04-17 ENCOUNTER — Encounter: Payer: Self-pay | Admitting: Internal Medicine

## 2021-04-17 ENCOUNTER — Other Ambulatory Visit: Payer: Self-pay

## 2021-04-17 VITALS — BP 116/74 | HR 60 | Temp 97.3°F | Wt 185.0 lb

## 2021-04-17 DIAGNOSIS — B20 Human immunodeficiency virus [HIV] disease: Secondary | ICD-10-CM | POA: Diagnosis not present

## 2021-04-17 DIAGNOSIS — Z23 Encounter for immunization: Secondary | ICD-10-CM

## 2021-04-17 DIAGNOSIS — Z7689 Persons encountering health services in other specified circumstances: Secondary | ICD-10-CM

## 2021-04-17 DIAGNOSIS — F99 Mental disorder, not otherwise specified: Secondary | ICD-10-CM

## 2021-04-17 DIAGNOSIS — F17210 Nicotine dependence, cigarettes, uncomplicated: Secondary | ICD-10-CM | POA: Diagnosis not present

## 2021-04-17 DIAGNOSIS — F5105 Insomnia due to other mental disorder: Secondary | ICD-10-CM | POA: Diagnosis not present

## 2021-04-17 DIAGNOSIS — F32A Depression, unspecified: Secondary | ICD-10-CM

## 2021-04-17 DIAGNOSIS — G4700 Insomnia, unspecified: Secondary | ICD-10-CM | POA: Insufficient documentation

## 2021-04-17 MED ORDER — BIKTARVY 50-200-25 MG PO TABS: 1.0000 | ORAL_TABLET | Freq: Every day | ORAL | 11 refills | Status: DC

## 2021-04-17 MED ORDER — TRAZODONE HCL 50 MG PO TABS: 50.0000 mg | ORAL_TABLET | Freq: Every day | ORAL | 5 refills | Status: DC

## 2021-04-17 NOTE — Assessment & Plan Note (Signed)
I suspect that his difficulty sleeping is related to worsening depression.  He is willing to try trazodone at bedtime.  He will follow-up in 4 weeks.

## 2021-04-17 NOTE — Progress Notes (Signed)
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  Previous Medications   ALBUTEROL (VENTOLIN HFA) 108 (90 BASE) MCG/ACT INHALER    Inhale into the lungs.   IMIQUIMOD (ALDARA) 5 % CREAM    Apply topically 3 (three) times a week.   METRONIDAZOLE (METROGEL) 0.75 % GEL    Apply 1 application topically 2 (two) times daily.   PODOFILOX (CONDYLOX) 0.5 % GEL    Apply topically 2 (two) times daily. Apply 2 times daily for 3 days. Wait 4 days before repeating. You may repeat up to 3 times  Modified Medications   Modified Medication Previous Medication   BICTEGRAVIR-EMTRICITABINE-TENOFOVIR AF (BIKTARVY) 50-200-25 MG TABS TABLET bictegravir-emtricitabine-tenofovir AF (BIKTARVY) 50-200-25 MG TABS tablet      Take 1 tablet by mouth daily.    Take 1 tablet by mouth daily.  Discontinued Medications   No medications on file    Subjective: [facility] is in for his routine HIV follow-up visit.  He has not had any problems obtaining, taking or tolerating his Biktarvy and does not believe that he has missed doses.  He tells me again that he thinks he is going "crazy".  He said that he built a deck for one of his children and had difficulty recalling how to bill the stairs which he has done many times in the past.  He describes problems with memory and concentration.  He remains concerned about conspiracy theories and believes that the COVID-vaccine has been manufactured to cause cancer and kill people.  He is currently not working and does not feel that he is mentally capable of working right now.  He is  feeling more depressed and anxious.  He has developed problems sleeping.  He usually gets in bed around 11 PM but usually only falls asleep around 2.  He gets up around 7 AM.  This is much less sleep that he would normally get.  This has occurred over the past year.  He is still smoking cigarettes.  He does not drink alcohol or use illicit drugs.  He lives alone.  He has a dog but gets no exercise.  Review of Systems: Review of Systems  Constitutional:  Positive for malaise/fatigue. Negative for weight loss.  Musculoskeletal:  Positive for joint pain.  Neurological:  Negative for headaches.  Psychiatric/Behavioral:  Positive for depression and memory loss. Negative for substance abuse and suicidal ideas. The patient is nervous/anxious and has insomnia.    Past Medical History:  Diagnosis Date   Depression 10/17/2020   High cholesterol    Substance abuse (HCC)     Social History   Tobacco Use   Smoking status: Every Day    Packs/day: 1.00    Years: 41.00    Pack years: 41.00    Types: Cigarettes   Smokeless tobacco: Never   Tobacco comments:    not ready to quit  Substance Use Topics   Alcohol use: No    Alcohol/week: 0.0 standard drinks    Comment: Past history of Cocaine, Marijuana  and LSD use    Drug use: No    Family History  Problem Relation Age of Onset   Cancer Sister    Diabetes Father    Heart disease Father    Hypothyroidism Father    Hypothyroidism Brother     No Known Allergies  Health Maintenance  Topic Date Due   COVID-19 Vaccine (1) Never done   Zoster Vaccines- Shingrix (1 of 2) Never done   COLONOSCOPY (Pts 45-21yrs Insurance coverage will need to be confirmed)  Never done   Pneumococcal Vaccine 62-51 Years old (2 - PCV) 11/15/2014   INFLUENZA VACCINE  12/30/2020   TETANUS/TDAP  06/08/2027   Hepatitis C Screening  Completed   HIV Screening  Completed   HPV VACCINES  Aged Out    Objective:  Vitals:   04/17/21 1009  BP: 116/74  Pulse: 60   Temp: (!) 97.3 F (36.3 C)  TempSrc: Temporal  Weight: 185 lb (83.9 kg)   Body mass index is 26.54 kg/m.  Physical Exam Constitutional:      Comments: He appears worried but calm and talkative.  Cardiovascular:     Rate and Rhythm: Normal rate.  Pulmonary:     Effort: Pulmonary effort is normal.  Musculoskeletal:        General: Tenderness present. No swelling.     Comments: He has pain with palpation of his anterior left shoulder.  He has difficulty with abduction.  Skin:    Findings: No rash.  Neurological:     General: No focal deficit present.  Psychiatric:        Mood and Affect: Mood normal.    Lab Results Lab Results  Component Value Date   WBC 6.9 10/17/2020   HGB 15.2 10/17/2020   HCT 43.8 10/17/2020   MCV 94.2 10/17/2020   PLT 153 10/17/2020    Lab Results  Component Value Date   CREATININE 1.15 10/17/2020   BUN 11 10/17/2020   NA 139 10/17/2020   K 4.1 10/17/2020   CL 104 10/17/2020   CO2 30 10/17/2020    Lab Results  Component Value Date   ALT 17 10/17/2020   AST 19 10/17/2020   ALKPHOS 63 11/30/2016   BILITOT 0.6 10/17/2020    Lab Results  Component Value Date   CHOL 183 10/17/2020   HDL 38 (L) 10/17/2020   LDLCALC 122 (H) 10/17/2020   TRIG 118 10/17/2020   CHOLHDL 4.8 10/17/2020   Lab Results  Component Value Date   LABRPR REACTIVE (A) 10/17/2020   RPRTITER 1:2 (H) 10/17/2020   HIV 1 RNA Quant  Date Value  10/17/2020 Not Detected Copies/mL  02/01/2020 <20 Copies/mL  04/05/2019 <20 NOT DETECTED copies/mL   CD4 T Cell Abs (/uL)  Date Value  10/17/2020 941  02/01/2020 1,002  04/05/2019 914     Problem List Items Addressed This Visit       High   Human immunodeficiency virus (HIV) disease (HCC)    His infection has been under excellent, long-term control.  He will get repeat lab work today and continue [employer].  He received his annual influenza vaccine today.  He refuses COVID vaccination.      Relevant Medications    bictegravir-emtricitabine-tenofovir AF (BIKTARVY) 50-200-25 MG TABS tablet   Other Relevant Orders   T-helper cell (CD4)- ([facility] clinic only)   HIV-1 RNA quant-no reflex-bld     Unprioritized   Cigarette smoker    He is not ready to attempt to cut  down or quit smoking.      Depression    I talked to him about the option of counseling and medical treatment for worsening depression.  He is currently not ready to accept that yet.  I will try again to get him a PCP.      Relevant Medications   traZODone (DESYREL) 50 MG tablet   Insomnia    I suspect that his difficulty sleeping is related to worsening depression.  He is willing to try trazodone at bedtime.  He will follow-up in 4 weeks.      Relevant Medications   traZODone (DESYREL) 50 MG tablet   Other Visit Diagnoses     Encounter to establish care    -  Primary   Relevant Orders   Ambulatory referral to Internal Medicine         Cliffton Asters, MD [facility] [phone] pager   [phone] cell 04/17/2021, 10:39 AM

## 2021-04-17 NOTE — Assessment & Plan Note (Signed)
I talked to him about the option of counseling and medical treatment for worsening depression.  He is currently not ready to accept that yet.  I will try again to get him a PCP.

## 2021-04-17 NOTE — Assessment & Plan Note (Signed)
He is not ready to attempt to cut down or quit smoking.

## 2021-04-17 NOTE — Assessment & Plan Note (Signed)
His infection has been under excellent, long-term control.  He will get repeat lab work today and continue [employer].  He received his annual influenza vaccine today.  He refuses COVID vaccination.

## 2021-04-18 LAB — T-HELPER CELL (CD4) - (RCID CLINIC ONLY)
CD4 % Helper T Cell: 31 % — ABNORMAL LOW (ref 33–65)
CD4 T Cell Abs: 839 /uL (ref 400–1790)

## 2021-04-21 ENCOUNTER — Encounter: Payer: Self-pay | Admitting: Internal Medicine

## 2021-04-21 LAB — HIV-1 RNA QUANT-NO REFLEX-BLD
HIV 1 RNA Quant: NOT DETECTED Copies/mL
HIV-1 RNA Quant, Log: NOT DETECTED Log cps/mL

## 2021-05-14 ENCOUNTER — Encounter: Payer: Self-pay | Admitting: Internal Medicine

## 2021-05-14 ENCOUNTER — Ambulatory Visit (INDEPENDENT_AMBULATORY_CARE_PROVIDER_SITE_OTHER): Payer: 59 | Admitting: Internal Medicine

## 2021-05-14 ENCOUNTER — Other Ambulatory Visit: Payer: Self-pay

## 2021-05-14 DIAGNOSIS — B20 Human immunodeficiency virus [HIV] disease: Secondary | ICD-10-CM | POA: Diagnosis not present

## 2021-05-14 DIAGNOSIS — F17210 Nicotine dependence, cigarettes, uncomplicated: Secondary | ICD-10-CM

## 2021-05-14 DIAGNOSIS — R0602 Shortness of breath: Secondary | ICD-10-CM

## 2021-05-14 HISTORY — DX: Shortness of breath: R06.02

## 2021-05-14 NOTE — Assessment & Plan Note (Signed)
His infection has been under excellent, long-term control.  He will continue Biktarvy and follow-up in 2 months.

## 2021-05-14 NOTE — Progress Notes (Signed)
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   BICTEGRAVIR-EMTRICITABINE-TENOFOVIR AF (BIKTARVY) 50-200-25 MG TABS TABLET    Take 1 tablet by mouth daily.   IMIQUIMOD (ALDARA) 5 % CREAM    Apply topically 3 (three) times a week.   METRONIDAZOLE (METROGEL) 0.75 % GEL    Apply 1 application topically 2 (two) times daily.   PODOFILOX (CONDYLOX) 0.5 % GEL    Apply topically 2 (two) times daily. Apply 2 times daily for 3 days. Wait 4 days before repeating. You may repeat up to 3 times   TRAZODONE (DESYREL) 50 MG TABLET    Take 1 tablet (50 mg total) by mouth at bedtime.  Modified Medications   No medications on file  Discontinued Medications   No medications on file    Subjective: [facility] is in for his routine HIV follow-up visit.  He denies having any problems obtaining, taking or tolerating his Biktarvy.  He does not believe that he has been missing doses.  He has tried taking the trazodone to help him sleep but he says that he cannot tell if it has made any difference.  He has not had any adverse side effects but he says that he has been worried because recently he has sometimes woken up in the middle of the night "unable to breathe."  He says that he has to stand up and walk around before he feels better.  He does not relate increased wheezing when this occurs.  This was happening before he ever took trazodone.  He has not been able to cut down on his cigarettes.  He has not attempted to call one of  the PCP offices likely asked him during his last several visits.  Review of Systems: Review of Systems  Constitutional:  Negative for fever and weight loss.  Respiratory:  Positive for shortness of breath and wheezing. Negative for cough and sputum production.   Cardiovascular:  Negative for chest pain.  Psychiatric/Behavioral:  Negative for depression. The patient is nervous/anxious and has insomnia.    Past Medical History:  Diagnosis Date   Depression 10/17/2020   High cholesterol    Shortness of breath 05/14/2021   Substance abuse (HCC)     Social History   Tobacco Use   Smoking status: Every Day    Packs/day: 1.00    Years: 41.00    Pack years: 41.00    Types: Cigarettes   Smokeless tobacco: Never   Tobacco comments:    not ready to quit  Substance Use Topics   Alcohol use: No    Alcohol/week: 0.0 standard drinks    Comment: Past history of Cocaine, Marijuana and LSD use    Drug use: No    Family History  Problem Relation Age of Onset   Cancer Sister    Diabetes Father    Heart disease Father    Hypothyroidism Father    Hypothyroidism Brother     No  Known Allergies  Health Maintenance  Topic Date Due   COVID-19 Vaccine (1) Never done   Zoster Vaccines- Shingrix (1 of 2) Never done   COLONOSCOPY (Pts 45-42yrs Insurance coverage will need to be confirmed)  Never done   Pneumococcal Vaccine 13-22 Years old (2 - PCV) 11/15/2014   TETANUS/TDAP  06/08/2027   INFLUENZA VACCINE  Completed   Hepatitis C Screening  Completed   HIV Screening  Completed   HPV VACCINES  Aged Out    Objective:  Vitals:   05/14/21 1014  BP: 114/75  Pulse: 62  Temp: (!) 97.3 F (36.3 C)  TempSrc: Oral  SpO2: 98%  Weight: 185 lb (83.9 kg)   Body mass index is 26.54 kg/m.  Physical Exam Constitutional:      Comments: He is slightly worried but otherwise in no distress.  Cardiovascular:     Rate and Rhythm: Normal rate and regular rhythm.     Heart sounds: No murmur  heard. Pulmonary:     Effort: Pulmonary effort is normal.     Breath sounds: Normal breath sounds.  Psychiatric:        Mood and Affect: Mood normal.    Lab Results Lab Results  Component Value Date   WBC 6.9 10/17/2020   HGB 15.2 10/17/2020   HCT 43.8 10/17/2020   MCV 94.2 10/17/2020   PLT 153 10/17/2020    Lab Results  Component Value Date   CREATININE 1.15 10/17/2020   BUN 11 10/17/2020   NA 139 10/17/2020   K 4.1 10/17/2020   CL 104 10/17/2020   CO2 30 10/17/2020    Lab Results  Component Value Date   ALT 17 10/17/2020   AST 19 10/17/2020   ALKPHOS 63 11/30/2016   BILITOT 0.6 10/17/2020    Lab Results  Component Value Date   CHOL 183 10/17/2020   HDL 38 (L) 10/17/2020   LDLCALC 122 (H) 10/17/2020   TRIG 118 10/17/2020   CHOLHDL 4.8 10/17/2020   Lab Results  Component Value Date   LABRPR REACTIVE (A) 10/17/2020   RPRTITER 1:2 (H) 10/17/2020   HIV 1 RNA Quant (Copies/mL)  Date Value  04/17/2021 Not Detected  10/17/2020 Not Detected  02/01/2020 <20   CD4 T Cell Abs (/uL)  Date Value  04/17/2021 839  10/17/2020 941  02/01/2020 1,002     Problem List Items Addressed This Visit       High   Human immunodeficiency virus (HIV) disease (HCC)    His infection has been under excellent, long-term control.  He will continue Biktarvy and follow-up in 2 months.        Unprioritized   Cigarette smoker    He has some insight into the damage that cigarettes have caused.  He says that he had knows that he has COPD and asthma.  Unfortunately he is still not terribly motivated to attempt to quit.      Shortness of breath    I am not sure what is causing his episodes of shortness of breath at night.  I doubt that it is orthopnea and he has no signs of heart failure.  It is possible he may have sleep apnea.  I strongly encouraged him again to find a PCP.         Cliffton Asters, MD [facility] [phone] pager   [phone] cell 05/14/2021, 1:53 PM

## 2021-05-14 NOTE — Assessment & Plan Note (Signed)
He has some insight into the damage that cigarettes have caused.  He says that he had knows that he has COPD and asthma.  Unfortunately he is still not terribly motivated to attempt to quit.

## 2021-05-14 NOTE — Assessment & Plan Note (Signed)
I am not sure what is causing his episodes of shortness of breath at night.  I doubt that it is orthopnea and he has no signs of heart failure.  It is possible he may have sleep apnea.  I strongly encouraged him again to find a PCP.

## 2021-05-16 ENCOUNTER — Other Ambulatory Visit: Payer: Self-pay | Admitting: Internal Medicine

## 2021-05-16 DIAGNOSIS — B20 Human immunodeficiency virus [HIV] disease: Secondary | ICD-10-CM

## 2021-06-03 ENCOUNTER — Encounter: Payer: Self-pay | Admitting: Internal Medicine

## 2021-06-23 ENCOUNTER — Other Ambulatory Visit: Payer: Self-pay

## 2021-06-23 ENCOUNTER — Encounter: Payer: Self-pay | Admitting: Family Medicine

## 2021-06-23 ENCOUNTER — Ambulatory Visit (HOSPITAL_COMMUNITY)
Admission: RE | Admit: 2021-06-23 | Discharge: 2021-06-23 | Disposition: A | Discharge status: Home / Self Care | Payer: 59 | Source: Ambulatory Visit | Attending: Family Medicine | Admitting: Family Medicine

## 2021-06-23 ENCOUNTER — Ambulatory Visit: Payer: 59 | Attending: Family Medicine | Admitting: Family Medicine

## 2021-06-23 VITALS — BP 130/81 | HR 58 | Ht 70.0 in | Wt 185.0 lb

## 2021-06-23 DIAGNOSIS — R29818 Other symptoms and signs involving the nervous system: Secondary | ICD-10-CM

## 2021-06-23 DIAGNOSIS — F17210 Nicotine dependence, cigarettes, uncomplicated: Secondary | ICD-10-CM | POA: Diagnosis present

## 2021-06-23 DIAGNOSIS — B20 Human immunodeficiency virus [HIV] disease: Secondary | ICD-10-CM | POA: Diagnosis not present

## 2021-06-23 DIAGNOSIS — Z1211 Encounter for screening for malignant neoplasm of colon: Secondary | ICD-10-CM | POA: Diagnosis not present

## 2021-06-23 DIAGNOSIS — R0609 Other forms of dyspnea: Secondary | ICD-10-CM

## 2021-06-23 MED ORDER — SPIRIVA HANDIHALER 18 MCG IN CAPS: 18.0000 ug | ORAL_CAPSULE | Freq: Every day | RESPIRATORY_TRACT | 12 refills | Status: DC

## 2021-06-23 MED ORDER — ALBUTEROL SULFATE HFA 108 (90 BASE) MCG/ACT IN AERS: 2.0000 | INHALATION_SPRAY | Freq: Four times a day (QID) | RESPIRATORY_TRACT | 3 refills | Status: DC | PRN

## 2021-06-23 MED ORDER — BUPROPION HCL ER (XL) 150 MG PO TB24: 150.0000 mg | ORAL_TABLET | Freq: Every day | ORAL | 3 refills | Status: DC

## 2021-06-23 NOTE — Patient Instructions (Signed)
Managing the Challenge of Quitting Smoking ?Quitting smoking is a physical and mental challenge. You will face cravings, withdrawal symptoms, and temptation. Before quitting, work with your health care provider to make a plan that can help you manage quitting. Preparation can help you quit and keep you from giving in. ?How to manage lifestyle changes ?Managing stress ?Stress can make you want to smoke, and wanting to smoke may cause stress. It is important to find ways to manage your stress. You might try some of the following: ?Practice relaxation techniques. ?Breathe slowly and deeply, in through your nose and out through your mouth. ?Listen to music. ?Soak in a bath or take a shower. ?Imagine a peaceful place or vacation. ?Get some support. ?Talk with family or friends about your stress. ?Join a support group. ?Talk with a counselor or therapist. ?Get some physical activity. ?Go for a walk, run, or bike ride. ?Play a favorite sport. ?Practice yoga. ? ?Medicines ?Talk with your health care provider about medicines that might help you deal with cravings and make quitting easier for you. ?Relationships ?Social situations can be difficult when you are quitting smoking. To manage this, you can: ?Avoid parties and other social situations where people might be smoking. ?Avoid alcohol. ?Leave right away if you have the urge to smoke. ?Explain to your family and friends that you are quitting smoking. Ask for support and let them know you might be a bit grumpy. ?Plan activities where smoking is not an option. ?General instructions ?Be aware that many people gain weight after they quit smoking. However, not everyone does. To keep from gaining weight, have a plan in place before you quit and stick to the plan after you quit. Your plan should include: ?Having healthy snacks. When you have a craving, it may help to: ?Eat popcorn, carrots, celery, or other cut vegetables. ?Chew sugar-free gum. ?Changing how you eat. ?Eat small  portion sizes at meals. ?Eat 4-6 small meals throughout the day instead of 1-2 large meals a day. ?Be mindful when you eat. Do not watch television or do other things that might distract you as you eat. ?Exercising regularly. ?Make time to exercise each day. If you do not have time for a long workout, do short bouts of exercise for 5-10 minutes several times a day. ?Do some form of strengthening exercise, such as weight lifting. ?Do some exercise that gets your heart beating and causes you to breathe deeply, such as walking fast, running, swimming, or biking. This is very important. ?Drinking plenty of water or other low-calorie or no-calorie drinks. Drink 6-8 glasses of water daily. ? ?How to recognize withdrawal symptoms ?Your body and mind may experience discomfort as you try to get used to not having nicotine in your system. These effects are called withdrawal symptoms. They may include: ?Feeling hungrier than normal. ?Having trouble concentrating. ?Feeling irritable or restless. ?Having trouble sleeping. ?Feeling depressed. ?Craving a cigarette. ?To manage withdrawal symptoms: ?Avoid places, people, and activities that trigger your cravings. ?Remember why you want to quit. ?Get plenty of sleep. ?Avoid coffee and other caffeinated drinks. These may worsen some of your symptoms. ?These symptoms may surprise you. But be assured that they are normal to have when quitting smoking. ?How to manage cravings ?Come up with a plan for how to deal with your cravings. The plan should include the following: ?A definition of the specific situation you want to deal with. ?An alternative action you will take. ?A clear idea for how this action   will help. ?The name of someone who might help you with this. ?Cravings usually last for 5-10 minutes. Consider taking the following actions to help you with your plan to deal with cravings: ?Keep your mouth busy. ?Chew sugar-free gum. ?Suck on hard candies or a straw. ?Brush your  teeth. ?Keep your hands and body busy. ?Change to a different activity right away. ?Squeeze or play with a ball. ?Do an activity or a hobby, such as making bead jewelry, practicing needlepoint, or working with wood. ?Mix up your normal routine. ?Take a short exercise break. Go for a quick walk or run up and down stairs. ?Focus on doing something kind or helpful for someone else. ?Call a friend or family member to talk during a craving. ?Join a support group. ?Contact a quitline. ?Where to find support ?To get help or find a support group: ?Call the National Cancer Institute's Smoking Quitline: 1-800-QUIT NOW (784-8669) ?Visit the website of the Substance Abuse and Mental Health Services Administration: www.samhsa.gov ?Text QUIT to SmokefreeTXT: 478848 ?Where to find more information ?Visit these websites to find more information on quitting smoking: ?National Cancer Institute: www.smokefree.gov ?American Lung Association: www.lung.org ?American Cancer Society: www.cancer.org ?Centers for Disease Control and Prevention: www.cdc.gov ?American Heart Association: www.heart.org ?Contact a health care provider if: ?You want to change your plan for quitting. ?The medicines you are taking are not helping. ?Your eating feels out of control or you cannot sleep. ?Get help right away if: ?You feel depressed or become very anxious. ?Summary ?Quitting smoking is a physical and mental challenge. You will face cravings, withdrawal symptoms, and temptation to smoke again. Preparation can help you as you go through these challenges. ?Try different techniques to manage stress, handle social situations, and prevent weight gain. ?You can deal with cravings by keeping your mouth busy (such as by chewing gum), keeping your hands and body busy, calling family or friends, or contacting a quitline for people who want to quit smoking. ?You can deal with withdrawal symptoms by avoiding places where people smoke, getting plenty of rest, and  avoiding drinks with caffeine. ?This information is not intended to replace advice given to you by your health care provider. Make sure you discuss any questions you have with your health care provider. ?Document Revised: 01/24/2021 Document Reviewed: 03/07/2019 ?Elsevier Patient Education ? 2022 [employer] ? ?

## 2021-06-23 NOTE — Progress Notes (Signed)
Subjective:  Patient ID: Garrett Knight, male    DOB: 03-01-1966  Age: 56 y.o. MRN: [id number]  CC: New Patient (Initial Visit) and Breathing Problem   HPI Garrett Knight is a 56 y.o. year old male with a history of HIV, tobacco abuse who presents today to establish care.  Interval History: Last visit with infectious disease was in 05/2021 and he is currently on [place].  Last CD4 count was 839 in 04/2021.  He complains that sometimes he has a hard time breathing. A Doctor in [place] had told him he had Emphysema. He has smoked for 40 years about 1 - 1.5 ppd.Marland Kitchen Dyspnea occurs out of the blues.  Denies presence of wheezing or chest pain.  He has tried quitting in the past but has been unsuccessful. He has had attacks where he wakes up dyspneic and has to pace the room. He is unsure if he snores and has daytime fatigue but no daytime headaches or somnolence.  Past Medical History:  Diagnosis Date   Depression 10/17/2020   High cholesterol    Shortness of breath 05/14/2021   Substance abuse ([place])     Past Surgical History:  Procedure Laterality Date   APPENDECTOMY     LAPAROSCOPIC APPENDECTOMY  10/25/2011   Procedure: APPENDECTOMY LAPAROSCOPIC;  Surgeon: Madilyn Hook, DO;  Location: [facility];  Service: General;  Laterality: N/A;    Family History  Problem Relation Age of Onset   Cancer Sister    Diabetes Father    Heart disease Father    Hypothyroidism Father    Hypothyroidism Brother     No Known Allergies  Outpatient Medications Prior to Visit  Medication Sig Dispense Refill   bictegravir-emtricitabine-tenofovir AF (BIKTARVY) 50-200-25 MG TABS tablet Take 1 tablet by mouth daily. 30 tablet 11   imiquimod (ALDARA) 5 % cream Apply topically 3 (three) times a week. (Patient not taking: Reported on 04/17/2021) 12 each 2   metroNIDAZOLE (METROGEL) 0.75 % gel Apply 1 application topically 2 (two) times daily. (Patient not taking: Reported on 11/14/2020) 45 g 0   podofilox  (CONDYLOX) 0.5 % gel Apply topically 2 (two) times daily. Apply 2 times daily for 3 days. Wait 4 days before repeating. You may repeat up to 3 times (Patient not taking: Reported on 02/01/2020) 3.5 g 0   traZODone (DESYREL) 50 MG tablet Take 1 tablet (50 mg total) by mouth at bedtime. (Patient not taking: Reported on 05/14/2021) 30 tablet 5   albuterol (VENTOLIN HFA) 108 (90 Base) MCG/ACT inhaler Inhale into the lungs.     No facility-administered medications prior to visit.     ROS Review of Systems  Constitutional:  Negative for activity change and appetite change.  HENT:  Negative for sinus pressure and sore throat.   Eyes:  Negative for visual disturbance.  Respiratory:  Positive for shortness of breath. Negative for cough and chest tightness.   Cardiovascular:  Negative for chest pain and leg swelling.  Gastrointestinal:  Negative for abdominal distention, abdominal pain, constipation and diarrhea.  Endocrine: Negative.   Genitourinary:  Negative for dysuria.  Musculoskeletal:  Negative for joint swelling and myalgias.  Skin:  Negative for rash.  Allergic/Immunologic: Negative.   Neurological:  Negative for weakness, light-headedness and numbness.  Psychiatric/Behavioral:  Negative for dysphoric mood and suicidal ideas.    Objective:  BP 130/81    Pulse (!) 58    Ht 5\' 10"  (1.778 m)    Wt 185 lb (83.9 kg)  SpO2 99%    BMI 26.54 kg/m   BP/Weight 06/23/2021 05/14/2021 0000000  Systolic BP [id number]  Diastolic BP 81 75 74  Wt. (Lbs) 185 185 185  BMI 26.54 26.54 26.54      Physical Exam Constitutional:      Appearance: He is well-developed.  Cardiovascular:     Rate and Rhythm: Normal rate.     Heart sounds: Normal heart sounds. No murmur heard. Pulmonary:     Effort: Pulmonary effort is normal.     Breath sounds: No wheezing or rales.     Comments: Distant breath sounds heard more on the left hemithorax Chest:     Chest wall: No tenderness.  Abdominal:      General: Bowel sounds are normal. There is no distension.     Palpations: Abdomen is soft. There is no mass.     Tenderness: There is no abdominal tenderness.  Musculoskeletal:        General: Normal range of motion.     Right lower leg: No edema.     Left lower leg: No edema.  Neurological:     Mental Status: He is alert and oriented to person, place, and time.  Psychiatric:        Mood and Affect: Mood normal.    CMP Latest Ref Rng & Units 10/17/2020 02/01/2020 04/05/2019  Glucose 65 - 99 mg/dL 85 85 116(H)  BUN 7 - 25 mg/dL 11 14 15   Creatinine 0.70 - 1.33 mg/dL 1.15 1.14 1.27  Sodium 135 - 146 mmol/L 139 139 142  Potassium 3.5 - 5.3 mmol/L 4.1 4.3 3.9  Chloride 98 - 110 mmol/L 104 106 107  CO2 20 - 32 mmol/L 30 29 28   Calcium 8.6 - 10.3 mg/dL 9.3 8.9 9.0  Total Protein 6.1 - 8.1 g/dL 7.0 7.3 6.4  Total Bilirubin 0.2 - 1.2 mg/dL 0.6 0.5 0.4  Alkaline Phos 40 - 115 U/L - - -  AST 10 - 35 U/L 19 18 15   ALT 9 - 46 U/L 17 16 12     Lipid Panel     Component Value Date/Time   CHOL 183 10/17/2020 1042   TRIG 118 10/17/2020 1042   HDL 38 (L) 10/17/2020 1042   CHOLHDL 4.8 10/17/2020 1042   VLDL 36 (H) 11/30/2016 0914   LDLCALC 122 (H) 10/17/2020 1042    CBC    Component Value Date/Time   WBC 6.9 10/17/2020 1042   RBC 4.65 10/17/2020 1042   HGB 15.2 10/17/2020 1042   HCT 43.8 10/17/2020 1042   PLT 153 10/17/2020 1042   MCV 94.2 10/17/2020 1042   MCH 32.7 10/17/2020 1042   MCHC 34.7 10/17/2020 1042   RDW 12.7 10/17/2020 1042   LYMPHSABS 2,592 05/31/2017 1020   MONOABS 0.7 11/14/2013 1639   EOSABS 320 05/31/2017 1020   BASOSABS 72 05/31/2017 1020    No results found for: HGBA1C  Assessment & Plan:  1. Suspected sleep apnea - Split night study; Future  2. Screening for colon cancer - Ambulatory referral to Gastroenterology  3. Smoking greater than 30 pack years Spent 3 minutes counseling on smoking cessation and he is willing to try Wellbutrin - Pulmonary  function test; Future - buPROPion (WELLBUTRIN XL) 150 MG 24 hr tablet; Take 1 tablet (150 mg total) by mouth daily.  Dispense: 30 tablet; Refill: 3 - DG Chest 2 View; Future  4. Human immunodeficiency virus (HIV) disease ([place]) Stable on current antiretroviral therapy  5. Other  form of dyspnea Given history of smoking and dyspnea he likely has COPD Will order PFTs We will proceed with placing him on Spiriva and Ventolin and advised that smoking cessation will be beneficial - tiotropium (SPIRIVA HANDIHALER) 18 MCG inhalation capsule; Place 1 capsule (18 mcg total) into inhaler and inhale daily.  Dispense: 30 capsule; Refill: 12 - albuterol (VENTOLIN HFA) 108 (90 Base) MCG/ACT inhaler; Inhale 2 puffs into the lungs every 6 (six) hours as needed for wheezing or shortness of breath.  Dispense: 1 each; Refill: 3    Meds ordered this encounter  Medications   tiotropium (SPIRIVA HANDIHALER) 18 MCG inhalation capsule    Sig: Place 1 capsule (18 mcg total) into inhaler and inhale daily.    Dispense:  30 capsule    Refill:  12   albuterol (VENTOLIN HFA) 108 (90 Base) MCG/ACT inhaler    Sig: Inhale 2 puffs into the lungs every 6 (six) hours as needed for wheezing or shortness of breath.    Dispense:  1 each    Refill:  3   buPROPion (WELLBUTRIN XL) 150 MG 24 hr tablet    Sig: Take 1 tablet (150 mg total) by mouth daily.    Dispense:  30 tablet    Refill:  3    Follow-up: Return in about 3 months (around 09/21/2021) for Chronic medical conditions.       Charlott Rakes, MD, FAAFP. [facility] [place], [place]   06/23/2021, 12:11 PM

## 2021-06-23 NOTE — Progress Notes (Signed)
Pt is having trouble breathing Not using inhaler.

## 2021-06-27 ENCOUNTER — Encounter: Payer: Self-pay | Admitting: Family Medicine

## 2021-08-05 ENCOUNTER — Ambulatory Visit: Payer: Self-pay | Admitting: Family Medicine

## 2021-08-10 ENCOUNTER — Other Ambulatory Visit: Payer: Self-pay

## 2021-08-10 ENCOUNTER — Ambulatory Visit (HOSPITAL_BASED_OUTPATIENT_CLINIC_OR_DEPARTMENT_OTHER): Payer: 59 | Attending: Family Medicine | Admitting: Internal Medicine

## 2021-08-10 VITALS — Ht 70.0 in | Wt 175.0 lb

## 2021-08-10 DIAGNOSIS — R29818 Other symptoms and signs involving the nervous system: Secondary | ICD-10-CM | POA: Diagnosis not present

## 2021-08-10 DIAGNOSIS — R0683 Snoring: Secondary | ICD-10-CM | POA: Diagnosis present

## 2021-08-13 ENCOUNTER — Encounter: Payer: Self-pay | Admitting: Internal Medicine

## 2021-08-13 ENCOUNTER — Ambulatory Visit (INDEPENDENT_AMBULATORY_CARE_PROVIDER_SITE_OTHER): Payer: 59 | Admitting: Internal Medicine

## 2021-08-13 ENCOUNTER — Other Ambulatory Visit: Payer: Self-pay

## 2021-08-13 DIAGNOSIS — B20 Human immunodeficiency virus [HIV] disease: Secondary | ICD-10-CM | POA: Diagnosis not present

## 2021-08-13 DIAGNOSIS — F32A Depression, unspecified: Secondary | ICD-10-CM | POA: Diagnosis not present

## 2021-08-13 DIAGNOSIS — A519 Early syphilis, unspecified: Secondary | ICD-10-CM | POA: Diagnosis not present

## 2021-08-13 DIAGNOSIS — F17210 Nicotine dependence, cigarettes, uncomplicated: Secondary | ICD-10-CM

## 2021-08-13 DIAGNOSIS — F99 Mental disorder, not otherwise specified: Secondary | ICD-10-CM

## 2021-08-13 DIAGNOSIS — F5105 Insomnia due to other mental disorder: Secondary | ICD-10-CM

## 2021-08-13 MED ORDER — BIKTARVY 50-200-25 MG PO TABS: 1.0000 | ORAL_TABLET | Freq: Every day | ORAL | 11 refills | Status: DC

## 2021-08-13 NOTE — Progress Notes (Signed)
? ?   ? ? ? ? ?Patient Active Problem List  ? Diagnosis Date Noted  ? Human immunodeficiency virus (HIV) disease (HCC) 11/21/2013  ?  Priority: High  ? Shortness of breath 05/14/2021  ? Insomnia 04/17/2021  ? Depression 10/17/2020  ? 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 Acne 04/20/2019  ? Genital warts 11/28/2013  ? Smoking greater than 30 pack years 11/28/2013  ? Hypertriglyceridemia 11/28/2013  ? ? ?Patient's Medications  ?New Prescriptions  ? No medications on file  ?Previous Medications  ? ALBUTEROL (VENTOLIN HFA) 108 (90 BASE) MCG/ACT INHALER    Inhale 2 puffs into the lungs every 6 (six) hours as needed for wheezing or shortness of breath.  ? BUPROPION (WELLBUTRIN XL) 150 MG 24 HR TABLET    Take 1 tablet (150 mg total) by mouth daily.  ? IMIQUIMOD (ALDARA) 5 % CREAM    Apply topically 3 (three) times a week.  ? METRONIDAZOLE (METROGEL) 0.75 % GEL    Apply 1 application topically 2 (two) times daily.  ? PODOFILOX (CONDYLOX) 0.5 % GEL    Apply topically 2 (two) times daily. Apply 2 times daily for 3 days. Wait 4 days before repeating. You may repeat up to 3 times  ? TIOTROPIUM (SPIRIVA HANDIHALER) 18 MCG INHALATION CAPSULE    Place 1 capsule (18 mcg total) into inhaler and inhale daily.  ? TRAZODONE (DESYREL) 50 MG TABLET    Take 1 tablet (50 mg total) by mouth at bedtime.  ?Modified Medications  ? Modified Medication Previous Medication  ? BICTEGRAVIR-EMTRICITABINE-TENOFOVIR AF (BIKTARVY) 50-200-25 MG TABS TABLET bictegravir-emtricitabine-tenofovir AF (BIKTARVY) 50-200-25 MG TABS tablet  ?    Take 1 tablet by mouth daily.    Take 1 tablet by mouth daily.  ?Discontinued Medications  ? No medications on file  ? ? ?Subjective: ?Garrett Knight is in for his routine HIV follow-up visit.  He denies any problems obtaining or tolerating his Biktarvy but is not sure if he has been missing doses.  He says that he normally takes it each morning.  He continues to have difficulty sleeping.  He says that he was unable to fall asleep last  night till about 2 AM.  He had a sleep study several days ago but has not gotten the results yet.  He continues to feel depressed and blames it on "what our political system is doing".  He is still smoking about half a pack of cigarettes daily but is interested in quitting.  He says that once he was able to quit for 30 days. ? ?Review of Systems: ?Review of Systems  ?Constitutional:  Positive for malaise/fatigue. Negative for fever and weight loss.  ?Respiratory:  Negative for cough.   ?Cardiovascular:  Negative for chest pain.  ?Psychiatric/Behavioral:  Positive for depression. The patient has insomnia. The patient is not nervous/anxious.   ? ?Past Medical History:  ?Diagnosis Date  ? Depression 10/17/2020  ? High cholesterol   ? Shortness of breath 05/14/2021  ? Substance abuse (HCC)   ? ? ?Social History  ? ?Tobacco Use  ? Smoking status: Every Day  ?  Packs/day: 1.00  ?  Years: 41.00  ?  Pack years: 41.00  ?  Types: Cigarettes  ? Smokeless tobacco: Never  ? Tobacco comments:  ?  Trying quit  ?Substance Use Topics  ? Alcohol use: No  ?  Alcohol/week: 0.0 standard drinks  ?  Comment: Past history of Cocaine, Marijuana and LSD use   ? Drug use: No  ? ? ?  Family History  ?Problem Relation Age of Onset  ? Cancer Sister   ? Diabetes Father   ? Heart disease Father   ? Hypothyroidism Father   ? Hypothyroidism Brother   ? ? ?No Known Allergies ? ?Health Maintenance  ?Topic Date Due  ? COVID-19 Vaccine (1) Never done  ? COLONOSCOPY (Pts 45-42yrs Insurance coverage will need to be confirmed)  Never done  ? Zoster Vaccines- Shingrix (1 of 2) 09/21/2021 (Originally 04/10/1984)  ? TETANUS/TDAP  06/08/2027  ? INFLUENZA VACCINE  Completed  ? Hepatitis C Screening  Completed  ? HIV Screening  Completed  ? HPV VACCINES  Aged Out  ? ? ?Objective: ? ?Vitals:  ? 08/13/21 1047  ?BP: 110/66  ?Pulse: (!) 56  ?Temp: (!) 97 ?F (36.1 ?C)  ?TempSrc: Temporal  ?Weight: 183 lb (83 kg)  ? ?Body mass index is 26.26 kg/m?. ? ?Physical  Exam ?Constitutional:   ?   Comments: He tells me that he did not know why he was scheduled for a visit here today.  ?Cardiovascular:  ?   Rate and Rhythm: Normal rate and regular rhythm.  ?   Heart sounds: No murmur heard. ?Pulmonary:  ?   Effort: Pulmonary effort is normal.  ?   Breath sounds: Normal breath sounds.  ? ? ?Lab Results ?Lab Results  ?Component Value Date  ? WBC 6.9 10/17/2020  ? HGB 15.2 10/17/2020  ? HCT 43.8 10/17/2020  ? MCV 94.2 10/17/2020  ? PLT 153 10/17/2020  ?  ?Lab Results  ?Component Value Date  ? CREATININE 1.15 10/17/2020  ? BUN 11 10/17/2020  ? NA 139 10/17/2020  ? K 4.1 10/17/2020  ? CL 104 10/17/2020  ? CO2 30 10/17/2020  ?  ?Lab Results  ?Component Value Date  ? ALT 17 10/17/2020  ? AST 19 10/17/2020  ? ALKPHOS 63 11/30/2016  ? BILITOT 0.6 10/17/2020  ?  ?Lab Results  ?Component Value Date  ? CHOL 183 10/17/2020  ? HDL 38 (L) 10/17/2020  ? LDLCALC 122 (H) 10/17/2020  ? TRIG 118 10/17/2020  ? CHOLHDL 4.8 10/17/2020  ? ?Lab Results  ?Component Value Date  ? LABRPR REACTIVE (A) 10/17/2020  ? RPRTITER 1:2 (H) 10/17/2020  ? ?HIV 1 RNA Quant (Copies/mL)  ?Date Value  ?04/17/2021 Not Detected  ?10/17/2020 Not Detected  ?02/01/2020 <20  ? ?CD4 T Cell Abs (/uL)  ?Date Value  ?04/17/2021 839  ?10/17/2020 941  ?02/01/2020 1,002  ? ?  ?Problem List Items Addressed This Visit   ? ?  ? High  ? Human immunodeficiency virus (HIV) disease (HCC)  ?  His infection has been under very good, long-term control since diagnosis several years ago but I am worried that his adherence has been deteriorating.  I gave him a pillbox and asked him to use it for several weeks and monitor if he is missing doses of Biktarvy.  He will get repeat lab work today and follow-up in 6 months. ?  ?  ? Relevant Medications  ? bictegravir-emtricitabine-tenofovir AF (BIKTARVY) 50-200-25 MG TABS tablet  ? Other Relevant Orders  ? T-helper cells (CD4) count (not at [facility])  ? HIV-1 RNA quant-no reflex-bld  ? RPR  ?  ? Unprioritized   ? Smoking greater than 30 pack years  ?  I encouraged him to continue to work on his plan to cut down and quit again. ?  ?  ? Early syphilis  ?  We will repeat his RPR today.  There was an appropriate decrease last year following treatment for early syphilis in late 2021. ?  ?  ? Relevant Medications  ? bictegravir-emtricitabine-tenofovir AF (BIKTARVY) 50-200-25 MG TABS tablet  ? Depression  ?  He continues to be angry and depressed and believes that the only thing that would change that would be to overflow our government. ?  ?  ? Insomnia  ?  If he has sleep apnea that would help explain his disrupted sleep, poor memory and concentration and chronic fatigue. ?  ?  ? ? ? ? ?Cliffton AstersJohn Micael Barb, MD ?[facility] ?[facility] ?[phone] pager   [phone] cell ?08/13/2021, 11:12 AM ? ?

## 2021-08-13 NOTE — Assessment & Plan Note (Signed)
He continues to be angry and depressed and believes that the only thing that would change that would be to overflow our government. ?

## 2021-08-13 NOTE — Assessment & Plan Note (Signed)
His infection has been under very good, long-term control since diagnosis several years ago but I am worried that his adherence has been deteriorating.  I gave him a pillbox and asked him to use it for several weeks and monitor if he is missing doses of Biktarvy.  He will get repeat lab work today and follow-up in 6 months. ?

## 2021-08-13 NOTE — Assessment & Plan Note (Signed)
If he has sleep apnea that would help explain his disrupted sleep, poor memory and concentration and chronic fatigue. ?

## 2021-08-13 NOTE — Assessment & Plan Note (Signed)
I encouraged him to continue to work on his plan to cut down and quit again. ?

## 2021-08-13 NOTE — Assessment & Plan Note (Signed)
We will repeat his RPR today.  There was an appropriate decrease last year following treatment for early syphilis in late 2021. ?

## 2021-08-16 DIAGNOSIS — R29818 Other symptoms and signs involving the nervous system: Secondary | ICD-10-CM | POA: Diagnosis not present

## 2021-08-16 NOTE — Procedures (Signed)
? ? ? ?  Patient Name: Garrett Knight, Garrett Knight ?Study Date: 08/10/2021 ?Gender: Male ?D.O.B: 15-Dec-1965 ?Age (years): 28 ?Referring Provider: Not Available ?Height (inches): 70 ?Interpreting Physician: Jetty Duhamel MD, ABSM ?Weight (lbs): 175 ?RPSGT: Steffey, Caryn Bee ?BMI: 25 ?MRN: [id number] ?Neck Size: 15.00 ? ?CLINICAL INFORMATION ?Sleep Study Type: NPSG ?Indication for sleep study: OSA ?Epworth Sleepiness Score: 9 ? ?SLEEP STUDY TECHNIQUE ?As per the AASM Manual for the Scoring of Sleep and Associated Events v2.3 (April 2016) with a hypopnea requiring 4% desaturations. ? ?The channels recorded and monitored were frontal, central and occipital EEG, electrooculogram (EOG), submentalis EMG (chin), nasal and oral airflow, thoracic and abdominal wall motion, anterior tibialis EMG, snore microphone, electrocardiogram, and pulse oximetry. ? ?MEDICATIONS ?Medications self-administered by patient taken the night of the study : none reported ? ?SLEEP ARCHITECTURE ?The study was initiated at 11:27:17 PM and ended at 5:30:44 AM. ? ?Sleep onset time was 128.7 minutes and the sleep efficiency was 56.3%%. The total sleep time was 204.5 minutes. ? ?Stage REM latency was 70.0 minutes. ? ?The patient spent 12.5%% of the night in stage N1 sleep, 72.4%% in stage N2 sleep, 0.0%% in stage N3 and 15.2% in REM. ? ?Alpha intrusion was absent. ? ?Supine sleep was 30.07%. ? ?RESPIRATORY PARAMETERS ?The overall apnea/hypopnea index (AHI) was 0.3 per hour. There were 0 total apneas, including 0 obstructive, 0 central and 0 mixed apneas. There were 1 hypopneas and 8 RERAs. ? ?The AHI during Stage REM sleep was 1.9 per hour. ? ?AHI while supine was 1.0 per hour. ? ?The mean oxygen saturation was 91.2%. The minimum SpO2 during sleep was 88.0%. ? ?soft snoring was noted during this study. ? ?CARDIAC DATA ?The 2 lead EKG demonstrated sinus rhythm. The mean heart rate was 46.7 beats per minute. Other EKG findings include: None. ? ?LEG MOVEMENT DATA ?The  total PLMS were 0 with a resulting PLMS index of 0.0. Associated arousal with leg movement index was 0.0 . ? ?IMPRESSIONS ?- No significant obstructive sleep apnea occurred during this study (AHI = 0.3/h). ?- The patient had minimal or no oxygen desaturation during the study (Min O2 = 88.0%)   Mean 92.3%. ?- The patient snored with soft snoring volume. ?- No cardiac abnormalities were noted during this study. ?- Clinically significant periodic limb movements did not occur during sleep. No significant associated arousals. ?- Difficulty initiating sleep. ? ?DIAGNOSIS ?- Primary Snoring ? ?RECOMMENDATIONS ?- Be careful with alcohol, sedatives and other CNS depressants that may worsen sleep apnea and disrupt normal sleep architecture. ?- Sleep hygiene should be reviewed to assess factors that may improve sleep quality. ?- Weight management and regular exercise should be initiated or continued if appropriate. ? ?[Electronically signed] 08/16/2021 02:24 PM ? ?Jetty Duhamel MD, ABSM ?Diplomate, American Board of Sleep Medicine ? ? ?NPI: [id number] ?  ? ? ? ? ? ? ? ? ? ? ? ? ? ? ? ? ? ? ? ? ? ?Adryel Wortmann ?Diplomate, Biomedical engineer of Sleep Medicine ? ?ELECTRONICALLY SIGNED ON:  08/16/2021, 2:21 PM ?[facility] ?PH: [phone]   FX: [phone] ?ACCREDITED BY THE AMERICAN ACADEMY OF SLEEP MEDICINE ?

## 2021-08-17 LAB — HIV-1 RNA QUANT-NO REFLEX-BLD
HIV 1 RNA Quant: NOT DETECTED Copies/mL
HIV-1 RNA Quant, Log: NOT DETECTED Log cps/mL

## 2021-08-17 LAB — RPR TITER: RPR Titer: 1:1 {titer} — ABNORMAL HIGH

## 2021-08-17 LAB — FLUORESCENT TREPONEMAL AB(FTA)-IGG-BLD: Fluorescent Treponemal ABS: REACTIVE — AB

## 2021-08-17 LAB — T-HELPER CELLS (CD4) COUNT (NOT AT ARMC)
Absolute CD4: 979 cells/uL (ref 490–1740)
CD4 T Helper %: 29 % — ABNORMAL LOW (ref 30–61)
Total lymphocyte count: 3330 cells/uL (ref 850–3900)

## 2021-08-17 LAB — RPR: RPR Ser Ql: REACTIVE — AB

## 2021-08-18 ENCOUNTER — Encounter: Payer: Self-pay | Admitting: Family Medicine

## 2021-08-21 ENCOUNTER — Other Ambulatory Visit: Payer: Self-pay | Admitting: Pharmacist

## 2021-08-21 DIAGNOSIS — B20 Human immunodeficiency virus [HIV] disease: Secondary | ICD-10-CM

## 2021-08-21 MED ORDER — BICTEGRAVIR-EMTRICITAB-TENOFOV 50-200-25 MG PO TABS: 1.0000 | ORAL_TABLET | Freq: Every day | ORAL | 0 refills | Status: AC

## 2021-08-21 NOTE — Progress Notes (Signed)
Medication Samples have been provided to the patient. ? ?Drug name: Biktarvy        ?Strength: 50/200/25 mg       ?Qty: 14 tablets (2 bottles) ?LOT: [id number]   ?Exp.Date: 10/24 ? ?Dosing instructions: Take one tablet by mouth once daily ? ?The patient has been instructed regarding the correct time, dose, and frequency of taking this medication, including desired effects and most common side effects.  ? ?Margarite Gouge, PharmD, CPP ?Clinical Pharmacist Practitioner ?Infectious Diseases Clinical Pharmacist ?[facility] ? ?

## 2021-09-22 ENCOUNTER — Ambulatory Visit: Payer: 59 | Attending: Family Medicine | Admitting: Family Medicine

## 2021-09-22 ENCOUNTER — Encounter: Payer: Self-pay | Admitting: Family Medicine

## 2021-09-22 VITALS — BP 112/69 | HR 72 | Ht 70.0 in | Wt 180.0 lb

## 2021-09-22 DIAGNOSIS — F17210 Nicotine dependence, cigarettes, uncomplicated: Secondary | ICD-10-CM

## 2021-09-22 DIAGNOSIS — R0609 Other forms of dyspnea: Secondary | ICD-10-CM | POA: Diagnosis not present

## 2021-09-22 DIAGNOSIS — B20 Human immunodeficiency virus [HIV] disease: Secondary | ICD-10-CM

## 2021-09-22 NOTE — Patient Instructions (Signed)
Steps to Quit Smoking Smoking tobacco is the leading cause of preventable death. It can affect almost every organ in the body. Smoking puts you and those around you at risk for developing many serious chronic diseases. Quitting smoking can be very challenging. Do not get discouraged if you are not successful the first time. Some people need to make many attempts to quit before they achieve long-term success. Do your best to stick to your quit plan, and talk with your health care provider if you have any questions or concerns. How do I get ready to quit? When you decide to quit smoking, create a plan to help you succeed. Before you quit: Pick a date to quit. Set a date within the next 2 weeks to give you time to prepare. Write down the reasons why you are quitting. Keep this list in places where you will see it often. Tell your family, friends, and co-workers that you are quitting. Support from people you are close to can make quitting easier. Talk with your health care provider about your options for quitting smoking. Find out what treatment options are covered by your health insurance. Identify people, places, things, and activities that make you want to smoke (triggers). Avoid them. What first steps can I take to quit smoking? Throw away all cigarettes at home, at work, and in your car. Throw away smoking accessories, such as ashtrays and lighters. Clean your car. Make sure to empty the ashtray. Clean your home, including curtains and carpets. What strategies can I use to quit smoking? Talk with your health care provider about combining strategies, such as taking medicines while you are also receiving in-person counseling. Using these two strategies together makes you more likely to succeed in quitting than if you used either strategy on its own. If you are pregnant or breastfeeding, talk with your health care provider about finding counseling or other support strategies to quit smoking. Do not  take medicine to help you quit smoking unless your health care provider tells you to. Quit right away Quit smoking completely, instead of gradually reducing how much you smoke over a period of time. Stopping smoking right away may be more successful than gradually quitting. Attend in-person counseling to help you build problem-solving skills. You are more likely to succeed in quitting if you attend counseling sessions regularly. Even short sessions of 10 minutes can be effective. Take medicine You may take medicines to help you quit smoking. Some medicines require a prescription. You can also purchase over-the-counter medicines. Medicines may have nicotine in them to replace the nicotine in cigarettes. Medicines may: Help to stop cravings. Help to relieve withdrawal symptoms. Your health care provider may recommend: Nicotine patches, gum, or lozenges. Nicotine inhalers or sprays. Non-nicotine medicine that you take by mouth. Find resources Find resources and support systems that can help you quit smoking and remain smoke-free after you quit. These resources are most helpful when you use them often. They include: Online chats with a counselor. Telephone quitlines. Printed self-help materials. Support groups or group counseling. Text messaging programs. Mobile phone apps or applications. Use apps that can help you stick to your quit plan by providing reminders, tips, and encouragement. Examples of free services include Quit Guide from the CDC and smokefree.gov  What can I do to make it easier to quit?  Reach out to your family and friends for support and encouragement. Call telephone quitlines, such as 1-800-QUIT-NOW, reach out to support groups, or work with a counselor for   support. Ask people who smoke to avoid smoking around you. Avoid places that trigger you to smoke, such as bars, parties, or smoke-break areas at work. Spend time with people who do not smoke. Lessen the stress in your  life. Stress can be a smoking trigger for some people. To lessen stress, try: Exercising regularly. Doing deep-breathing exercises. Doing yoga. Meditating. What benefits will I see if I quit smoking? Over time, you should start to see positive results, such as: Improved sense of smell and taste. Decreased coughing and sore throat. Slower heart rate. Lower blood pressure. Clearer and healthier skin. The ability to breathe more easily. Fewer sick days. Summary Quitting smoking can be very challenging. Do not get discouraged if you are not successful the first time. Some people need to make many attempts to quit before they achieve long-term success. When you decide to quit smoking, create a plan to help you succeed. Quit smoking right away, not slowly over a period of time. Find resources and support systems that can help you quit smoking and remain smoke-free after you quit. This information is not intended to replace advice given to you by your health care provider. Make sure you discuss any questions you have with your health care provider. Document Revised: 05/09/2021 Document Reviewed: 05/09/2021 Elsevier Patient Education  2023 Elsevier Inc.  

## 2021-09-22 NOTE — Progress Notes (Signed)
? ?Subjective:  ?Patient ID: Garrett Knight, male    DOB: 30-Oct-1965  Age: 56 y.o. MRN: [id number] ? ?CC: Breathing Problem ? ? ?HPI ?Garrett Knight is a 56 y.o. year old male with a history of HIV, tobacco abuse  ? ?Interval History: ?He underwent a sleep study in 07/2021 which revealed no significant obstructive sleep apnea with an AHI of 0.3/h. ?He informs me that he wakes up finding out that he is not breathing and is afraid he will die. It happens at work and he just discovers he cannot breathe for 20-30 secs and then it resolves. ?I prescribed Spiriva for him at his last visit along with albuterol for presumptive COPD but he has never used it. ? ?He just underwent his PFT 3 days ago and the test is not resulted yet. He had difficulty during the aspiration phase of his PFT ?He has phlegm in his throat he states but 'does not like to put so many medications in his body'. ?Smoked 2 ppd of Cigarettes  since he was 13 ? ?His PHQ-9 score is elevated at 16 and he attributes this to being depressed due to the fact that he is currently not working.  He informs me he is afraid he would stop breathing at work and can drop something on someone. ?Last visit with infectious disease was last month. ? ?Past Medical History:  ?Diagnosis Date  ? Depression 10/17/2020  ? High cholesterol   ? Shortness of breath 05/14/2021  ? Substance abuse ([place])   ? ? ?Past Surgical History:  ?Procedure Laterality Date  ? APPENDECTOMY    ? LAPAROSCOPIC APPENDECTOMY  10/25/2011  ? Procedure: APPENDECTOMY LAPAROSCOPIC;  Surgeon: Madilyn Hook, DO;  Location: [facility];  Service: General;  Laterality: N/A;  ? ? ?Family History  ?Problem Relation Age of Onset  ? Cancer Sister   ? Diabetes Father   ? Heart disease Father   ? Hypothyroidism Father   ? Hypothyroidism Brother   ? ? ?Social History  ? ?Socioeconomic History  ? Marital status: Divorced  ?  Spouse name: Not on file  ? Number of children: Not on file  ? Years of education: Not on file  ?  Highest education level: Not on file  ?Occupational History  ? Not on file  ?Tobacco Use  ? Smoking status: Every Day  ?  Packs/day: 1.00  ?  Years: 41.00  ?  Pack years: 41.00  ?  Types: Cigarettes  ? Smokeless tobacco: Never  ? Tobacco comments:  ?  Trying quit  ?Substance and Sexual Activity  ? Alcohol use: No  ?  Alcohol/week: 0.0 standard drinks  ?  Comment: Past history of Cocaine, Marijuana and LSD use   ? Drug use: No  ? Sexual activity: Yes  ?  Partners: Male  ?  Birth control/protection: Condom  ?  Comment: accepted condoms  ?Other Topics Concern  ? Not on file  ?Social History Narrative  ? Not on file  ? ?Social Determinants of Health  ? ?Financial Resource Strain: Not on file  ?Food Insecurity: Not on file  ?Transportation Needs: Not on file  ?Physical Activity: Not on file  ?Stress: Not on file  ?Social Connections: Not on file  ? ? ?No Known Allergies ? ?Outpatient Medications Prior to Visit  ?Medication Sig Dispense Refill  ? albuterol (VENTOLIN HFA) 108 (90 Base) MCG/ACT inhaler Inhale 2 puffs into the lungs every 6 (six) hours as needed for wheezing or shortness of breath.  1 each 3  ? bictegravir-emtricitabine-tenofovir AF (BIKTARVY) 50-200-25 MG TABS tablet Take 1 tablet by mouth daily. 30 tablet 11  ? buPROPion (WELLBUTRIN XL) 150 MG 24 hr tablet Take 1 tablet (150 mg total) by mouth daily. 30 tablet 3  ? imiquimod (ALDARA) 5 % cream Apply topically 3 (three) times a week. 12 each 2  ? podofilox (CONDYLOX) 0.5 % gel Apply topically 2 (two) times daily. Apply 2 times daily for 3 days. Wait 4 days before repeating. You may repeat up to 3 times 3.5 g 0  ? tiotropium (SPIRIVA HANDIHALER) 18 MCG inhalation capsule Place 1 capsule (18 mcg total) into inhaler and inhale daily. 30 capsule 12  ? metroNIDAZOLE (METROGEL) 0.75 % gel Apply 1 application topically 2 (two) times daily. (Patient not taking: Reported on 08/13/2021) 45 g 0  ? traZODone (DESYREL) 50 MG tablet Take 1 tablet (50 mg total) by mouth at  bedtime. (Patient not taking: Reported on 08/13/2021) 30 tablet 5  ? ?No facility-administered medications prior to visit.  ? ? ? ?ROS ?Review of Systems  ?Constitutional:  Negative for activity change and appetite change.  ?HENT:  Negative for sinus pressure and sore throat.   ?Eyes:  Negative for visual disturbance.  ?Respiratory:  Positive for shortness of breath. Negative for cough and chest tightness.   ?Cardiovascular:  Negative for chest pain and leg swelling.  ?Gastrointestinal:  Negative for abdominal distention, abdominal pain, constipation and diarrhea.  ?Endocrine: Negative.   ?Genitourinary:  Negative for dysuria.  ?Musculoskeletal:  Negative for joint swelling and myalgias.  ?Skin:  Negative for rash.  ?Allergic/Immunologic: Negative.   ?Neurological:  Negative for weakness, light-headedness and numbness.  ?Psychiatric/Behavioral:  Negative for dysphoric mood and suicidal ideas.   ? ?Objective:  ?BP 112/69   Pulse 72   Ht 5\' 10"  (1.778 m)   Wt 180 lb (81.6 kg)   SpO2 98%   BMI 25.83 kg/m?  ? ? ?  09/22/2021  ?  2:14 PM 08/13/2021  ? 10:47 AM 08/10/2021  ?  8:32 PM  ?BP/Weight  ?Systolic BP [id number]   ?Diastolic BP 69 66   ?Wt. (Lbs) 180 183 175  ?BMI 25.83 kg/m2 26.26 kg/m2 25.11 kg/m2  ? ? ? ? ?Physical Exam ?Constitutional:   ?   Appearance: He is well-developed.  ?Cardiovascular:  ?   Rate and Rhythm: Normal rate.  ?   Heart sounds: Normal heart sounds. No murmur heard. ?Pulmonary:  ?   Effort: Pulmonary effort is normal.  ?   Breath sounds: Normal breath sounds. No wheezing or rales.  ?Chest:  ?   Chest wall: No tenderness.  ?Abdominal:  ?   General: Bowel sounds are normal. There is no distension.  ?   Palpations: Abdomen is soft. There is no mass.  ?   Tenderness: There is no abdominal tenderness.  ?Musculoskeletal:     ?   General: Normal range of motion.  ?   Right lower leg: No edema.  ?   Left lower leg: No edema.  ?Neurological:  ?   Mental Status: He is alert and oriented to person, place,  and time.  ?Psychiatric:     ?   Mood and Affect: Mood normal.  ? ? ? ?  Latest Ref Rng & Units 10/17/2020  ? 10:42 AM 02/01/2020  ? 11:28 AM 04/05/2019  ?  8:54 AM  ?CMP  ?Glucose 65 - 99 mg/dL 85   85   116    ?  BUN 7 - 25 mg/dL 11   14   15     ?Creatinine 0.70 - 1.33 mg/dL 1.15   1.14   1.27    ?Sodium 135 - 146 mmol/L 139   139   142    ?Potassium 3.5 - 5.3 mmol/L 4.1   4.3   3.9    ?Chloride 98 - 110 mmol/L 104   106   107    ?CO2 20 - 32 mmol/L 30   29   28     ?Calcium 8.6 - 10.3 mg/dL 9.3   8.9   9.0    ?Total Protein 6.1 - 8.1 g/dL 7.0   7.3   6.4    ?Total Bilirubin 0.2 - 1.2 mg/dL 0.6   0.5   0.4    ?AST 10 - 35 U/L 19   18   15     ?ALT 9 - 46 U/L 17   16   12     ? ? ?Lipid Panel  ?   ?Component Value Date/Time  ? CHOL 183 10/17/2020 1042  ? TRIG 118 10/17/2020 1042  ? HDL 38 (L) 10/17/2020 1042  ? CHOLHDL 4.8 10/17/2020 1042  ? VLDL 36 (H) 11/30/2016 0914  ? LDLCALC 122 (H) 10/17/2020 1042  ? ? ?CBC ?   ?Component Value Date/Time  ? WBC 6.9 10/17/2020 1042  ? RBC 4.65 10/17/2020 1042  ? HGB 15.2 10/17/2020 1042  ? HCT 43.8 10/17/2020 1042  ? PLT 153 10/17/2020 1042  ? MCV 94.2 10/17/2020 1042  ? MCH 32.7 10/17/2020 1042  ? MCHC 34.7 10/17/2020 1042  ? RDW 12.7 10/17/2020 1042  ? LYMPHSABS 2,592 05/31/2017 1020  ? MONOABS 0.7 11/14/2013 1639  ? EOSABS 320 05/31/2017 1020  ? BASOSABS 72 05/31/2017 1020  ? ? ?No results found for: HGBA1C ? ? ? ?  09/22/2021  ?  2:23 PM 08/13/2021  ? 10:49 AM 06/23/2021  ?  9:05 AM 04/17/2021  ? 10:06 AM 11/14/2020  ?  8:46 AM  ?Depression screen PHQ 2/9  ?Decreased Interest 2 0 3 1 1   ?Down, Depressed, Hopeless 2 1 3 3  0  ?PHQ - 2 Score 4 1 6 4 1   ?Altered sleeping 3  3 3    ?Tired, decreased energy 3  3 3    ?Change in appetite 1  3 0   ?Feeling bad or failure about yourself  0  1 3   ?Trouble concentrating 3  3 3    ?Moving slowly or fidgety/restless 2  1 0   ?Suicidal thoughts 0  0 0   ?PHQ-9 Score 16  20 16    ? ? ? ?  09/22/2021  ?  2:23 PM 06/23/2021  ?  9:05 AM  ?GAD 7 : Generalized  Anxiety Score  ?Nervous, Anxious, on Edge 0 3  ?Control/stop worrying 0 3  ?Worry too much - different things 0 3  ?Trouble relaxing 0 3  ?Restless 0 3  ?Easily annoyed or irritable 0 1  ?Afraid - awful might

## 2021-09-22 NOTE — Progress Notes (Signed)
Discuss breathing. ?

## 2021-10-10 ENCOUNTER — Encounter: Payer: Self-pay | Admitting: Family Medicine

## 2021-10-15 NOTE — Progress Notes (Signed)
Synopsis: Referred for dyspnea by Hoy Register, MD  Subjective:   PATIENT ID: Garrett Knight GENDER: male DOB: 06/04/1965, MRN: [id number]  Chief Complaint  Patient presents with   Pulmonary Consult    Referred by Dr Hoy Register.     55yM with history of HIV on biktarvy, smoking referred for dyspnea  Had bad bout of pneumonia 8-10 years ago.   4-5 years ago started to get episodic DOE/dyspnea, cough. Frequently pretty bad at night with almost PND. Had negative PSG recently. Happens at work a lot. No fever. No recent weight changes. 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   Otherwise pertinent review of systems is negative.  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 Some MJ, no vaping.   Past Medical History:  Diagnosis Date   Depression 10/17/2020   High cholesterol    Shortness of breath 05/14/2021   Substance abuse ([facility])      Family History  Problem Relation Age of Onset   Cancer Sister    Diabetes Father    Heart disease Father    Hypothyroidism Father    Hypothyroidism Brother      Past Surgical History:  Procedure Laterality Date   APPENDECTOMY     LAPAROSCOPIC APPENDECTOMY  10/25/2011   Procedure: APPENDECTOMY LAPAROSCOPIC;  Surgeon: Lodema Pilot, DO;  Location: [facility];  Service: General;  Laterality: N/A;    Social History   Socioeconomic History   Marital status: Divorced    Spouse name: Not on file   Number of children: Not on file   Years of education: Not on file   Highest education level: Not on file  Occupational History   Not on file  Tobacco Use   Smoking status: Every Day    Packs/day: 1.00    Years: 41.00    Pack years: 41.00    Types: Cigarettes   Smokeless tobacco: Never   Tobacco comments:    Trying quit  Substance and Sexual Activity   Alcohol use: No    Alcohol/week: 0.0 standard drinks    Comment: Past history of Cocaine, Marijuana and LSD use    Drug use: No   Sexual activity: Yes     Partners: Male    Birth control/protection: Condom    Comment: accepted condoms  Other Topics Concern   Not on file  Social History Narrative   Not on file   Social Determinants of Health   Financial Resource Strain: Not on file  Food Insecurity: Not on file  Transportation Needs: Not on file  Physical Activity: Not on file  Stress: Not on file  Social Connections: Not on file  Intimate Partner Violence: Not on file     No Known Allergies   Outpatient Medications Prior to Visit  Medication Sig Dispense Refill   albuterol (VENTOLIN HFA) 108 (90 Base) MCG/ACT inhaler Inhale 2 puffs into the lungs every 6 (six) hours as needed for wheezing or shortness of breath. 1 each 3   bictegravir-emtricitabine-tenofovir AF (BIKTARVY) 50-200-25 MG TABS tablet Take 1 tablet by mouth daily. 30 tablet 11   buPROPion (WELLBUTRIN XL) 150 MG 24 hr tablet Take 1 tablet (150 mg total) by mouth daily. 30 tablet 3   imiquimod (ALDARA) 5 % cream Apply topically 3 (three) times a week. 12 each 2   metroNIDAZOLE (METROGEL) 0.75 % gel Apply 1 application topically 2 (two) times daily. (Patient not taking: Reported on 08/13/2021) 45 g 0   podofilox (  CONDYLOX) 0.5 % gel Apply topically 2 (two) times daily. Apply 2 times daily for 3 days. Wait 4 days before repeating. You may repeat up to 3 times 3.5 g 0   tiotropium (SPIRIVA HANDIHALER) 18 MCG inhalation capsule Place 1 capsule (18 mcg total) into inhaler and inhale daily. 30 capsule 12   traZODone (DESYREL) 50 MG tablet Take 1 tablet (50 mg total) by mouth at bedtime. (Patient not taking: Reported on 08/13/2021) 30 tablet 5   No facility-administered medications prior to visit.       Objective:   Physical Exam:  General appearance: 56 y.o., male, NAD, conversant  Eyes: anicteric sclerae; PERRL, tracking appropriately HENT: NCAT; MMM Neck: Trachea midline; no lymphadenopathy, no JVD Lungs: CTAB, no crackles, no wheeze, with normal respiratory  effort CV: RRR, no murmur  Abdomen: Soft, non-tender; non-distended, BS present  Extremities: No peripheral edema, warm Skin: Normal turgor and texture; no rash Psych: Appropriate affect Neuro: Alert and oriented to person and place, no focal deficit     There were no vitals filed for this visit.   on RA BMI Readings from Last 3 Encounters:  09/22/21 25.83 kg/m  08/13/21 26.26 kg/m  08/10/21 25.11 kg/m   Wt Readings from Last 3 Encounters:  09/22/21 180 lb (81.6 kg)  08/13/21 183 lb (83 kg)  08/10/21 175 lb (79.4 kg)     CBC    Component Value Date/Time   WBC 6.9 10/17/2020 1042   RBC 4.65 10/17/2020 1042   HGB 15.2 10/17/2020 1042   HCT 43.8 10/17/2020 1042   PLT 153 10/17/2020 1042   MCV 94.2 10/17/2020 1042   MCH 32.7 10/17/2020 1042   MCHC 34.7 10/17/2020 1042   RDW 12.7 10/17/2020 1042   LYMPHSABS 2,592 05/31/2017 1020   MONOABS 0.7 11/14/2013 1639   EOSABS 320 05/31/2017 1020   BASOSABS 72 05/31/2017 1020      Chest Imaging: CXR 06/23/21 reviewed by me with hyperinflation  CT chest lung screen 10/06/21 report reviewed by me with RUL 80mm dominant nodule, Biapical pleural parenchymal scarring. Mild centrilobular  emphysema. Diffuse airway thickening. Adherent secretions within the  trachea and left main bronchus. Mild central bronchiectasis. No focal  consolidation.    Pulmonary Functions Testing Results:     View : No data to display.          PSG 08/10/21 AHI 0.3, min saturation was 88%       Assessment & Plan:   # Episodic DOE, cough # Emphysema # Likely COPD HIV will increase his risk for rapid progression of COPD/emphysema.   # Bronchiectasis Unable to confirm - no images available for review today of CT Chest 10/06/21  # Smoking Smoking cessation instruction/counseling given:  counseled patient on the dangers of tobacco use, advised patient to stop smoking, and reviewed strategies to maximize success. Precontemplative.    Plan: -  declines A1AT testing today, on the fence about continuing lung cancer screening despite discussion of risks/benefits of screening - will request [facility] Chest images to review - trial spiriva 2 puffs daily respimat, instructed re: optimal technique. Will send message to pharmacy to see which LAMA or LABA/LAMA most affordable for him    RTC 6 weeks with pfts   Omar Person, MD [facility] 10/17/2021 9:55 AM

## 2021-10-17 ENCOUNTER — Telehealth: Payer: Self-pay | Admitting: Student

## 2021-10-17 ENCOUNTER — Ambulatory Visit (INDEPENDENT_AMBULATORY_CARE_PROVIDER_SITE_OTHER): Payer: 59 | Admitting: Student

## 2021-10-17 ENCOUNTER — Encounter: Payer: Self-pay | Admitting: Student

## 2021-10-17 ENCOUNTER — Other Ambulatory Visit (HOSPITAL_COMMUNITY): Payer: Self-pay

## 2021-10-17 VITALS — BP 94/60 | HR 68 | Temp 98.0°F | Ht 70.0 in | Wt 182.0 lb

## 2021-10-17 DIAGNOSIS — J479 Bronchiectasis, uncomplicated: Secondary | ICD-10-CM

## 2021-10-17 DIAGNOSIS — R0609 Other forms of dyspnea: Secondary | ICD-10-CM

## 2021-10-17 DIAGNOSIS — J432 Centrilobular emphysema: Secondary | ICD-10-CM | POA: Diagnosis not present

## 2021-10-17 DIAGNOSIS — F17200 Nicotine dependence, unspecified, uncomplicated: Secondary | ICD-10-CM

## 2021-10-17 DIAGNOSIS — F17210 Nicotine dependence, cigarettes, uncomplicated: Secondary | ICD-10-CM | POA: Diagnosis not present

## 2021-10-17 MED ORDER — SPIRIVA RESPIMAT 2.5 MCG/ACT IN AERS: 2.0000 | INHALATION_SPRAY | Freq: Every day | RESPIRATORY_TRACT | 0 refills | Status: DC

## 2021-10-17 MED ORDER — TRELEGY ELLIPTA 100-62.5-25 MCG/ACT IN AEPB: 1.0000 | INHALATION_SPRAY | Freq: Every day | RESPIRATORY_TRACT | 11 refills | Status: DC

## 2021-10-17 NOTE — Telephone Encounter (Signed)
Test billing results for LABA/LAMA are as follows: Anoro: $15 Stiolto: $15 Bevespi: Non-formulary

## 2021-10-17 NOTE — Patient Instructions (Addendum)
-   spiriva respimat 2 puffs once daily in morning - breathing tests (PFTs) to see if you have COPD next visit - see you in 6 weeks - think about chantix

## 2021-10-17 NOTE — Telephone Encounter (Signed)
What would be least expensive laba/lama for him?

## 2021-10-20 MED ORDER — STIOLTO RESPIMAT 2.5-2.5 MCG/ACT IN AERS: 2.0000 | INHALATION_SPRAY | Freq: Every day | RESPIRATORY_TRACT | 11 refills | Status: DC

## 2021-11-07 ENCOUNTER — Encounter: Payer: Self-pay | Admitting: Student

## 2021-11-10 ENCOUNTER — Encounter: Payer: Self-pay | Admitting: Student

## 2021-11-11 ENCOUNTER — Telehealth: Payer: Self-pay | Admitting: Family Medicine

## 2021-11-11 NOTE — Telephone Encounter (Signed)
Can you please inform him that his CT scan to screen for lung cancer from [facility] revealed presence of emphysema in the lungs, gallstones.  It also reveals presence of lung nodules which is characterized as benign by the radiologist with recommendation to repeat screening in 12 months.

## 2021-11-11 NOTE — Telephone Encounter (Signed)
Patient was called and informed of CT scan results. 

## 2021-11-24 ENCOUNTER — Other Ambulatory Visit (HOSPITAL_COMMUNITY): Payer: Self-pay

## 2021-12-03 ENCOUNTER — Encounter: Payer: Self-pay | Admitting: Internal Medicine

## 2021-12-08 ENCOUNTER — Other Ambulatory Visit: Payer: Self-pay | Admitting: Pharmacist

## 2021-12-08 DIAGNOSIS — B20 Human immunodeficiency virus [HIV] disease: Secondary | ICD-10-CM

## 2021-12-08 MED ORDER — BIKTARVY 50-200-25 MG PO TABS: 1.0000 | ORAL_TABLET | Freq: Every day | ORAL | 0 refills | Status: DC

## 2021-12-08 NOTE — Progress Notes (Signed)
Medication Samples have been provided to the patient.  Drug name: Biktarvy        Strength: 50/200/25 mg       Qty: 7 tablets (1 bottle)   LOT: [id number]   Exp.Date: 01/2024  Dosing instructions: Take one tablet by mouth once daily  The patient has been instructed regarding the correct time, dose, and frequency of taking this medication, including desired effects and most common side effects.   Mineola Duan L. Yuepheng Schaller, PharmD, BCIDP, AAHIVP, CPP Clinical Pharmacist Practitioner Infectious Diseases Clinical Pharmacist [facility] 05/13/2020, 10:07 AM. 

## 2021-12-10 ENCOUNTER — Telehealth: Payer: Self-pay | Admitting: Student

## 2021-12-10 NOTE — Telephone Encounter (Signed)
Called and advised patient that when he comes into the office for his visit next week that he needs to fill out and sign medical release information to [employer]. I advised patient that he has to requested approval for [place] to get his records. Patient verbalized understanding. Nothing further needed

## 2021-12-15 NOTE — Progress Notes (Deleted)
Synopsis: Referred for dyspnea by Hoy Register, MD  Subjective:   PATIENT ID: Garrett Knight GENDER: male DOB: [birth year], MRN: [id number]  No chief complaint on file.  56yM with history of HIV on biktarvy, smoking referred for dyspnea  Had bad bout of pneumonia 8-10 years ago.   4-5 years ago started to get episodic DOE/dyspnea, cough. Frequently pretty bad at night with almost PND. Had negative PSG recently. Happens at work a lot. No fever. No recent weight changes. 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   Otherwise pertinent review of systems is negative.  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 Some MJ, no vaping.   Interval HPI: Started on trial of spiriva respimat, stiolto prescribed ($15/month), PFTs   Past Medical History:  Diagnosis Date   Depression 10/17/2020   High cholesterol    Shortness of breath 05/14/2021   Substance abuse (HCC)      Family History  Problem Relation Age of Onset   Cancer Sister    Diabetes Father    Heart disease Father    Hypothyroidism Father    Hypothyroidism Brother      Past Surgical History:  Procedure Laterality Date   APPENDECTOMY     LAPAROSCOPIC APPENDECTOMY  10/25/2011   Procedure: APPENDECTOMY LAPAROSCOPIC;  Surgeon: Lodema Pilot, DO;  Location: [facility];  Service: General;  Laterality: N/A;    Social History   Socioeconomic History   Marital status: Divorced    Spouse name: Not on file   Number of children: Not on file   Years of education: Not on file   Highest education level: Not on file  Occupational History   Not on file  Tobacco Use   Smoking status: Every Day    Packs/day: 1.00    Years: 41.00    Total pack years: 41.00    Types: Cigarettes   Smokeless tobacco: Never  Substance and Sexual Activity   Alcohol use: No    Alcohol/week: 0.0 standard drinks of alcohol    Comment: Past history of Cocaine, Marijuana and LSD use    Drug use: No   Sexual activity: Yes     Partners: Male    Birth control/protection: Condom    Comment: accepted condoms  Other Topics Concern   Not on file  Social History Narrative   Not on file   Social Determinants of Health   Financial Resource Strain: Not on file  Food Insecurity: Not on file  Transportation Needs: Not on file  Physical Activity: Not on file  Stress: Not on file  Social Connections: Not on file  Intimate Partner Violence: Not on file     No Known Allergies   Outpatient Medications Prior to Visit  Medication Sig Dispense Refill   albuterol (VENTOLIN HFA) 108 (90 Base) MCG/ACT inhaler Inhale 2 puffs into the lungs every 6 (six) hours as needed for wheezing or shortness of breath. 1 each 3   bictegravir-emtricitabine-tenofovir AF (BIKTARVY) 50-200-25 MG TABS tablet Take 1 tablet by mouth daily. 30 tablet 11   bictegravir-emtricitabine-tenofovir AF (BIKTARVY) 50-200-25 MG TABS tablet Take 1 tablet by mouth daily. 7 tablet 0   Tiotropium Bromide Monohydrate (SPIRIVA RESPIMAT) 2.5 MCG/ACT AERS Inhale 2 puffs into the lungs daily. 4 g 0   Tiotropium Bromide-Olodaterol (STIOLTO RESPIMAT) 2.5-2.5 MCG/ACT AERS Inhale 2 puffs into the lungs daily. 1 each 11   No facility-administered medications prior to visit.       Objective:  Physical Exam:  General appearance: 56 y.o., male, NAD, conversant  Eyes: anicteric sclerae; PERRL, tracking appropriately HENT: NCAT; MMM Neck: Trachea midline; no lymphadenopathy, no JVD Lungs: CTAB, no crackles, no wheeze, with normal respiratory effort CV: RRR, no murmur  Abdomen: Soft, non-tender; non-distended, BS present  Extremities: No peripheral edema, warm Skin: Normal turgor and texture; no rash Psych: Appropriate affect Neuro: Alert and oriented to person and place, no focal deficit     There were no vitals filed for this visit.   on RA BMI Readings from Last 3 Encounters:  10/17/21 26.11 kg/m  09/22/21 25.83 kg/m  08/13/21 26.26 kg/m   Wt  Readings from Last 3 Encounters:  10/17/21 182 lb (82.6 kg)  09/22/21 180 lb (81.6 kg)  08/13/21 183 lb (83 kg)     CBC    Component Value Date/Time   WBC 6.9 10/17/2020 1042   RBC 4.65 10/17/2020 1042   HGB 15.2 10/17/2020 1042   HCT 43.8 10/17/2020 1042   PLT 153 10/17/2020 1042   MCV 94.2 10/17/2020 1042   MCH 32.7 10/17/2020 1042   MCHC 34.7 10/17/2020 1042   RDW 12.7 10/17/2020 1042   LYMPHSABS 2,592 05/31/2017 1020   MONOABS 0.7 11/14/2013 1639   EOSABS 320 05/31/2017 1020   BASOSABS 72 05/31/2017 1020      Chest Imaging: CXR 06/23/21 reviewed by me with hyperinflation  CT chest lung screen 10/06/21 report reviewed by me with RUL 44mm dominant nodule, Biapical pleural parenchymal scarring. Mild centrilobular  emphysema. Diffuse airway thickening. Adherent secretions within the  trachea and left main bronchus. Mild central bronchiectasis. No focal  consolidation.    Pulmonary Functions Testing Results:     No data to display          PSG 08/10/21 AHI 0.3, min saturation was 88%       Assessment & Plan:   # Episodic DOE, cough # Emphysema # Likely COPD HIV will increase his risk for rapid progression of COPD/emphysema.   # Bronchiectasis Unable to confirm - no images available for review today of CT Chest 10/06/21  # Smoking Smoking cessation instruction/counseling given:  counseled patient on the dangers of tobacco use, advised patient to stop smoking, and reviewed strategies to maximize success. Precontemplative.    Plan: - declines A1AT testing today, on the fence about continuing lung cancer screening despite discussion of risks/benefits of screening - [facility] Chest images to [facility] - trial spiriva 2 puffs daily respimat, instructed re: optimal technique. Will send message to pharmacy to see which LAMA or LABA/LAMA most affordable for him    RTC 6 weeks with pfts   Omar Person, MD [facility] 12/15/2021  10:43 AM

## 2021-12-16 NOTE — Telephone Encounter (Signed)
Patient states that [facility] only needs a fax cover page requesting records. Faxed request for medical records to [phone].

## 2021-12-17 ENCOUNTER — Ambulatory Visit: Payer: 59 | Admitting: Student

## 2021-12-18 NOTE — Progress Notes (Signed)
Synopsis: Referred for dyspnea by Hoy Register, MD  Subjective:   PATIENT ID: Garrett Knight GENDER: male DOB: 12-05-1965, MRN: [id number]  Chief Complaint  Patient presents with   Follow-up    PFT's done today. He states his breathing is unchanged since the last visit. He sometimes wakes up in the night with SOB. He rarely uses rescue inhaler.    55yM with history of HIV on biktarvy, smoking referred for dyspnea  Had bad bout of pneumonia 8-10 years ago.   4-5 years ago started to get episodic DOE/dyspnea, cough. Frequently pretty bad at night with almost PND. Had negative PSG recently. Happens at work a lot. No fever. No recent weight changes. 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 Some MJ, no vaping.   Interval HPI: Started on trial of spiriva respimat, stiolto prescribed ($15/month), PFTs today with mild obstruction, moderately reduced diffusing capacity.  Had feeling of something like sinus congestion with use of spiriva. He feels like he has to take 'extra breaths' just to be able to sit down comfortably. He doesn't have a cough. Hasn't needed steroids or antibiotics for COPD.    Otherwise pertinent review of systems is negative.  Past Medical History:  Diagnosis Date   Depression 10/17/2020   High cholesterol    Shortness of breath 05/14/2021   Substance abuse (HCC)      Family History  Problem Relation Age of Onset   Cancer Sister    Diabetes Father    Heart disease Father    Hypothyroidism Father    Hypothyroidism Brother      Past Surgical History:  Procedure Laterality Date   APPENDECTOMY     LAPAROSCOPIC APPENDECTOMY  10/25/2011   Procedure: APPENDECTOMY LAPAROSCOPIC;  Surgeon: Lodema Pilot, DO;  Location: [facility];  Service: General;  Laterality: N/A;    Social History   Socioeconomic History   Marital status: Divorced    Spouse name: Not on file   Number of children: Not on  file   Years of education: Not on file   Highest education level: Not on file  Occupational History   Not on file  Tobacco Use   Smoking status: Every Day    Packs/day: 1.00    Years: 41.00    Total pack years: 41.00    Types: Cigarettes   Smokeless tobacco: Never  Substance and Sexual Activity   Alcohol use: No    Alcohol/week: 0.0 standard drinks of alcohol    Comment: Past history of Cocaine, Marijuana and LSD use    Drug use: No   Sexual activity: Yes    Partners: Male    Birth control/protection: Condom    Comment: accepted condoms  Other Topics Concern   Not on file  Social History Narrative   Not on file   Social Determinants of Health   Financial Resource Strain: Not on file  Food Insecurity: Not on file  Transportation Needs: Not on file  Physical Activity: Not on file  Stress: Not on file  Social Connections: Not on file  Intimate Partner Violence: Not on file     No Known Allergies   Outpatient Medications Prior to Visit  Medication Sig Dispense Refill   albuterol (VENTOLIN HFA) 108 (90 Base) MCG/ACT inhaler Inhale 2 puffs into the lungs every 6 (six) hours as needed for wheezing or shortness of breath. 1 each 3   bictegravir-emtricitabine-tenofovir AF (BIKTARVY) 50-200-25  MG TABS tablet Take 1 tablet by mouth daily. 7 tablet 0   bictegravir-emtricitabine-tenofovir AF (BIKTARVY) 50-200-25 MG TABS tablet Take 1 tablet by mouth daily. 30 tablet 11   Tiotropium Bromide Monohydrate (SPIRIVA RESPIMAT) 2.5 MCG/ACT AERS Inhale 2 puffs into the lungs daily. 4 g 0   Tiotropium Bromide-Olodaterol (STIOLTO RESPIMAT) 2.5-2.5 MCG/ACT AERS Inhale 2 puffs into the lungs daily. 1 each 11   No facility-administered medications prior to visit.       Objective:   Physical Exam:  General appearance: 56 y.o., male, NAD, conversant  Eyes: anicteric sclerae; PERRL, tracking appropriately HENT: NCAT; MMM Neck: Trachea midline; no lymphadenopathy, no JVD Lungs: CTAB, no  crackles, no wheeze, with normal respiratory effort CV: RRR, no murmur  Abdomen: Soft, non-tender; non-distended, BS present  Extremities: No peripheral edema, warm Skin: Normal turgor and texture; no rash Psych: Appropriate affect Neuro: Alert and oriented to person and place, no focal deficit     Vitals:   12/19/21 1326  BP: 118/60  Pulse: 63  Temp: 97.9 F (36.6 C)  TempSrc: Oral  SpO2: 98%  Weight: 179 lb (81.2 kg)  Height: 5\' 10"  (1.778 m)   98% on RA BMI Readings from Last 3 Encounters:  12/19/21 25.68 kg/m  10/17/21 26.11 kg/m  09/22/21 25.83 kg/m   Wt Readings from Last 3 Encounters:  12/19/21 179 lb (81.2 kg)  10/17/21 182 lb (82.6 kg)  09/22/21 180 lb (81.6 kg)     CBC    Component Value Date/Time   WBC 6.9 10/17/2020 1042   RBC 4.65 10/17/2020 1042   HGB 15.2 10/17/2020 1042   HCT 43.8 10/17/2020 1042   PLT 153 10/17/2020 1042   MCV 94.2 10/17/2020 1042   MCH 32.7 10/17/2020 1042   MCHC 34.7 10/17/2020 1042   RDW 12.7 10/17/2020 1042   LYMPHSABS 2,592 05/31/2017 1020   MONOABS 0.7 11/14/2013 1639   EOSABS 320 05/31/2017 1020   BASOSABS 72 05/31/2017 1020      Chest Imaging: CXR 06/23/21 reviewed by me with hyperinflation  CT chest lung screen 10/06/21 report reviewed by me with RUL 79mm dominant nodule, Biapical pleural parenchymal scarring. Mild centrilobular  emphysema. Diffuse airway thickening. Adherent secretions within the  trachea and left main bronchus. Mild central bronchiectasis. No focal  consolidation.    Pulmonary Functions Testing Results:    Latest Ref Rng & Units 12/19/2021   11:51 AM  PFT Results  FVC-Pre L 3.95  P  FVC-Predicted Pre % 80  P  FVC-Post L 4.27  P  FVC-Predicted Post % 87  P  Pre FEV1/FVC % % 67  P  Post FEV1/FCV % % 67  P  FEV1-Pre L 2.66  P  FEV1-Predicted Pre % 71  P  FEV1-Post L 2.88  P  DLCO uncorrected ml/min/mmHg 16.72  P  DLCO UNC% % 59  P  DLCO corrected ml/min/mmHg 16.72  P  DLCO COR  %Predicted % 59  P  DLVA Predicted % 61  P    P Preliminary result   PFTs 12/19/21 reviewed by me with mild obstruction, moderately reduced diffusing capacity  PSG 08/10/21 AHI 0.3, min saturation was 88%       Assessment & Plan:   # Episodic DOE, cough # Emphysema # COPD gold group B HIV will increase his risk for rapid progression of COPD/emphysema. Had ?sinus congestion feeling with spiriva respimat so never picked up stiolto.   # Bronchiectasis Unable to confirm - no images  available for review today of CT Chest 10/06/21  # Smoking Smoking cessation instruction/counseling given:  counseled patient on the dangers of tobacco use, advised patient to stop smoking, and reviewed strategies to maximize success. Precontemplative.    Plan: - has declined A1AT testing, reviewed unique lung cancer screening risks/benefits and he declines lung cancer screening enrollment - encouraged him to bring copy of disk with CT Chest on it to next visit - trial anoro 1 puff once daily, send message if helpful and we'll send in a prescription for it - albuterol prn   RTC 3 months   Omar Person, MD [facility] 12/19/2021 1:31 PM

## 2021-12-19 ENCOUNTER — Encounter: Payer: Self-pay | Admitting: Student

## 2021-12-19 ENCOUNTER — Ambulatory Visit (INDEPENDENT_AMBULATORY_CARE_PROVIDER_SITE_OTHER): Payer: 59 | Admitting: Student

## 2021-12-19 ENCOUNTER — Ambulatory Visit: Payer: 59 | Admitting: Student

## 2021-12-19 VITALS — BP 118/60 | HR 63 | Temp 97.9°F | Ht 70.0 in | Wt 179.0 lb

## 2021-12-19 DIAGNOSIS — J449 Chronic obstructive pulmonary disease, unspecified: Secondary | ICD-10-CM | POA: Diagnosis not present

## 2021-12-19 DIAGNOSIS — J479 Bronchiectasis, uncomplicated: Secondary | ICD-10-CM | POA: Diagnosis not present

## 2021-12-19 DIAGNOSIS — R0609 Other forms of dyspnea: Secondary | ICD-10-CM | POA: Diagnosis not present

## 2021-12-19 LAB — PULMONARY FUNCTION TEST
DL/VA % pred: 61 %
DL/VA: 2.64 ml/min/mmHg/L
DLCO cor % pred: 59 %
DLCO cor: 16.72 ml/min/mmHg
DLCO unc % pred: 59 %
DLCO unc: 16.72 ml/min/mmHg
FEF 25-75 Post: 2.31 L/sec
FEF 25-75 Pre: 1.79 L/sec
FEF2575-%Change-Post: 28 %
FEF2575-%Pred-Post: 72 %
FEF2575-%Pred-Pre: 56 %
FEV1-%Change-Post: 8 %
FEV1-%Pred-Post: 76 %
FEV1-%Pred-Pre: 71 %
FEV1-Post: 2.88 L
FEV1-Pre: 2.66 L
FEV1FVC-%Change-Post: 0 %
FEV1FVC-%Pred-Pre: 88 %
FEV6-%Change-Post: 8 %
FEV6-%Pred-Post: 91 %
FEV6-%Pred-Pre: 84 %
FEV6-Post: 4.27 L
FEV6-Pre: 3.95 L
FEV6FVC-%Pred-Post: 104 %
FEV6FVC-%Pred-Pre: 104 %
FVC-%Change-Post: 8 %
FVC-%Pred-Post: 87 %
FVC-%Pred-Pre: 80 %
FVC-Post: 4.27 L
FVC-Pre: 3.95 L
Post FEV1/FVC ratio: 67 %
Post FEV6/FVC ratio: 100 %
Pre FEV1/FVC ratio: 67 %
Pre FEV6/FVC Ratio: 100 %

## 2021-12-19 MED ORDER — ANORO ELLIPTA 62.5-25 MCG/ACT IN AEPB: 1.0000 | INHALATION_SPRAY | Freq: Every day | RESPIRATORY_TRACT | 0 refills | Status: DC

## 2021-12-19 NOTE — Patient Instructions (Signed)
Spirometry pre/post and dlco performed today. 

## 2021-12-19 NOTE — Patient Instructions (Addendum)
-   would call [facility] to make sure scan was performed in [place]. If so would ask medical records for a copy of the disc with that CT scan for [facility]. Then bring a copy of the disk to the office.  - anoro 1 puff once daily - let me know if it's effective and I'll send in prescription for it - albuterol as needed - keep up your effort to cut back on cigarettes! - see you in 3 months or sooner if need be!

## 2021-12-19 NOTE — Progress Notes (Signed)
Full PFT attempted Spirometry/pre-post and dlco performed. Patient not able to perform Pleth.

## 2021-12-23 ENCOUNTER — Ambulatory Visit: Payer: 59 | Attending: Family Medicine | Admitting: Family Medicine

## 2021-12-23 ENCOUNTER — Encounter: Payer: Self-pay | Admitting: Family Medicine

## 2021-12-23 VITALS — BP 103/64 | HR 60 | Temp 97.8°F | Ht 70.0 in | Wt 176.2 lb

## 2021-12-23 DIAGNOSIS — Z9189 Other specified personal risk factors, not elsewhere classified: Secondary | ICD-10-CM | POA: Diagnosis not present

## 2021-12-23 DIAGNOSIS — I77810 Thoracic aortic ectasia: Secondary | ICD-10-CM | POA: Diagnosis not present

## 2021-12-23 DIAGNOSIS — F17210 Nicotine dependence, cigarettes, uncomplicated: Secondary | ICD-10-CM | POA: Diagnosis not present

## 2021-12-23 DIAGNOSIS — J438 Other emphysema: Secondary | ICD-10-CM

## 2021-12-23 DIAGNOSIS — J439 Emphysema, unspecified: Secondary | ICD-10-CM

## 2021-12-23 HISTORY — DX: Emphysema, unspecified: J43.9

## 2021-12-23 NOTE — Patient Instructions (Signed)

## 2021-12-23 NOTE — Progress Notes (Signed)
Subjective:  Patient ID: Garrett Knight, male    DOB: [birth year]  Age: 56 y.o. MRN: [id number]  CC: COPD   HPI Garrett Knight is a 56 y.o. year old male with a history of HIV, tobacco abuse, COPD  Interval History: Seen by pulmonary last week and placed on Anoro, albuterol. States he wakes up coughing and spitting up stuff and "it is what is is". States he has been adherent with his inhalers. He states he has to take a deeper breath just to breathe. He has no additional concerns today. CT lung cancer screening from [facility] in 09/2021 had revealed: FINDINGS:   LUNG NODULES: Right upper lobe solid nodule that measures 5 mm (series 3,  180). Left upper lobe solid nodule that measures 3 mm (series 3, 189.  Additional micronodules marked on series 3.   LUNGS: Biapical pleural parenchymal scarring. Mild centrilobular  emphysema. Diffuse airway thickening. Adherent secretions within the  trachea and left main bronchus. Mild central bronchiectasis. No focal  consolidation.   PLEURA: No pleural thickening or effusion.   HEART: Heart size normal. No pericardial effusion. Mild ectasia of the  ascending aorta measuring up to 4.2 cm. Aortic calcifications.   CORONARY ARTERY CALCIFICATION: Minimal.   MEDIASTINUM/HILUM/AXILLA: Nonspecific prominent subcentimeter mediastinal  and left hilar lymph nodes.   OTHER FINDINGS: Cholelithiasis. Multilevel spondylosis. No acute osseous  abnormalities or suspicious osseous lesions.    Past Medical History:  Diagnosis Date   Depression 10/17/2020   Emphysema lung ([place]) 12/23/2021   High cholesterol    Shortness of breath 05/14/2021   Substance abuse ([place])     Past Surgical History:  Procedure Laterality Date   APPENDECTOMY     LAPAROSCOPIC APPENDECTOMY  10/25/2011   Procedure: APPENDECTOMY LAPAROSCOPIC;  Surgeon: Madilyn Hook, DO;  Location: [facility];  Service: General;  Laterality: N/A;    Family History  Problem Relation Age of  Onset   Cancer Sister    Diabetes Father    Heart disease Father    Hypothyroidism Father    Hypothyroidism Brother     Social History   Socioeconomic History   Marital status: Divorced    Spouse name: Not on file   Number of children: Not on file   Years of education: Not on file   Highest education level: Not on file  Occupational History   Not on file  Tobacco Use   Smoking status: Every Day    Packs/day: 1.00    Years: 41.00    Total pack years: 41.00    Types: Cigarettes   Smokeless tobacco: Never  Substance and Sexual Activity   Alcohol use: No    Alcohol/week: 0.0 standard drinks of alcohol    Comment: Past history of Cocaine, Marijuana and LSD use    Drug use: No   Sexual activity: Yes    Partners: Male    Birth control/protection: Condom    Comment: accepted condoms  Other Topics Concern   Not on file  Social History Narrative   Not on file   Social Determinants of Health   Financial Resource Strain: Not on file  Food Insecurity: Not on file  Transportation Needs: Not on file  Physical Activity: Not on file  Stress: Not on file  Social Connections: Not on file    No Known Allergies  Outpatient Medications Prior to Visit  Medication Sig Dispense Refill   albuterol (VENTOLIN HFA) 108 (90 Base) MCG/ACT inhaler Inhale 2 puffs into the lungs every  6 (six) hours as needed for wheezing or shortness of breath. 1 each 3   bictegravir-emtricitabine-tenofovir AF (BIKTARVY) 50-200-25 MG TABS tablet Take 1 tablet by mouth daily. 7 tablet 0   umeclidinium-vilanterol (ANORO ELLIPTA) 62.5-25 MCG/ACT AEPB Inhale 1 puff into the lungs daily. 7 each 0   No facility-administered medications prior to visit.     ROS Review of Systems  Constitutional:  Negative for activity change and appetite change.  HENT:  Negative for sinus pressure and sore throat.   Respiratory:  Positive for cough. Negative for chest tightness, shortness of breath and wheezing.    Cardiovascular:  Negative for chest pain and palpitations.  Gastrointestinal:  Negative for abdominal distention, abdominal pain and constipation.  Genitourinary: Negative.   Musculoskeletal: Negative.   Psychiatric/Behavioral:  Negative for behavioral problems and dysphoric mood.     Objective:  BP 103/64   Pulse 60   Temp 97.8 F (36.6 C) (Oral)   Ht _0  (1.778 m)   Wt 176 lb 3.2 oz (79.9 kg)   SpO2 99%   BMI 25.28 kg/m      12/23/2021   10:48 AM 12/19/2021    1:26 PM 10/17/2021    9:59 AM  BP/Weight  Systolic BP 235 573 94  Diastolic BP 64 60 60  Wt. (Lbs) 176.2 179 182  BMI 25.28 kg/m2 25.68 kg/m2 26.11 kg/m2      Physical Exam Constitutional:      Appearance: He is well-developed.  Cardiovascular:     Rate and Rhythm: Normal rate.     Heart sounds: Normal heart sounds. No murmur heard. Pulmonary:     Effort: Pulmonary effort is normal.     Breath sounds: Normal breath sounds. No wheezing or rales.  Chest:     Chest wall: No tenderness.  Abdominal:     General: Bowel sounds are normal. There is no distension.     Palpations: Abdomen is soft. There is no mass.     Tenderness: There is no abdominal tenderness.  Musculoskeletal:        General: Normal range of motion.     Right lower leg: No edema.     Left lower leg: No edema.  Neurological:     Mental Status: He is alert and oriented to person, place, and time.  Psychiatric:        Mood and Affect: Mood normal.        Latest Ref Rng & Units 10/17/2020   10:42 AM 02/01/2020   11:28 AM 04/05/2019    8:54 AM  CMP  Glucose 65 - 99 mg/dL 85  85  116   BUN 7 - 25 mg/dL _1 Creatinine 0.70 - 1.33 mg/dL 1.15  1.14  1.27   Sodium 135 - 146 mmol/L 139  139  142   Potassium 3.5 - 5.3 mmol/L 4.1  4.3  3.9   Chloride 98 - 110 mmol/L 104  106  107   CO2 20 - 32 mmol/L _2 Calcium 8.6 - 10.3 mg/dL 9.3  8.9  9.0   Total Protein 6.1 - 8.1 g/dL 7.0  7.3  6.4   Total Bilirubin 0.2 - 1.2 mg/dL  0.6  0.5  0.4   AST 10 - 35 U/L _3 ALT 9 - 46 U/L _4 Lipid Panel     Component Value Date/Time  CHOL 183 10/17/2020 1042   TRIG 118 10/17/2020 1042   HDL 38 (L) 10/17/2020 1042   CHOLHDL 4.8 10/17/2020 1042   VLDL 36 (H) 11/30/2016 0914   LDLCALC 122 (H) 10/17/2020 1042    CBC    Component Value Date/Time   WBC 6.9 10/17/2020 1042   RBC 4.65 10/17/2020 1042   HGB 15.2 10/17/2020 1042   HCT 43.8 10/17/2020 1042   PLT 153 10/17/2020 1042   MCV 94.2 10/17/2020 1042   MCH 32.7 10/17/2020 1042   MCHC 34.7 10/17/2020 1042   RDW 12.7 10/17/2020 1042   LYMPHSABS 2,592 05/31/2017 1020   MONOABS 0.7 11/14/2013 1639   EOSABS 320 05/31/2017 1020   BASOSABS 72 05/31/2017 1020    No results found for: "HGBA1C"   The 10-year ASCVD risk score (Arnett DK, et al., 2019) is: 8.7%   Values used to calculate the score:     Age: 6 years     Sex: Male     Is Non-Hispanic African American: No     Diabetic: No     Tobacco smoker: Yes     Systolic Blood Pressure: 735 mmHg     Is BP treated: No     HDL Cholesterol: 38 mg/dL     Total Cholesterol: 183 mg/dL  Assessment & Plan:  1. Aortic ectasia, thoracic (HCC) Incidental finding of 4.2 cm aortic ectasia - Ambulatory referral to Vascular Surgery  2. Smoking greater than 30 pack years Counseled on smoking cessation and hazardous effect of ongoing tobacco use but he is not interested in quitting at this time and declines pharmacological interventions Lung cancer screen negative for malignancy but revealed presence of multiple lung nodules which have been discussed with him  3. Other emphysema ([place]) From his history he does appear to have ongoing symptoms Adherence with his inhalers cannot be ascertained Continue follow-up with pulmonary Advised that smoking cessation will be beneficial with regards to retarding progression of condition  4. At high risk for cardiovascular disease 10-year ASCVD risk of  8.7% Counseled he will benefit from a statin but he declines - CMP14+EGFR - CBC with Differential/Platelet - LP+Non-HDL Cholesterol   Health Care Maintenance: Declines colorectal cancer screening No orders of the defined types were placed in this encounter.   Follow-up: Return in about 6 months (around 06/25/2022) for Chronic medical conditions.       Charlott Rakes, MD, FAAFP. [facility] [place], [place]   12/23/2021, 2:44 PM

## 2021-12-23 NOTE — Progress Notes (Signed)
Declined colonoscopy 

## 2021-12-24 LAB — LP+NON-HDL CHOLESTEROL
Cholesterol, Total: 191 mg/dL (ref 100–199)
HDL: 31 mg/dL — ABNORMAL LOW (ref >39)
LDL Chol Calc (NIH): 135 mg/dL — ABNORMAL HIGH (ref 0–99)
Total Non-HDL-Chol (LDL+VLDL): 160 mg/dL — ABNORMAL HIGH (ref 0–129)
Triglycerides: 136 mg/dL (ref 0–149)
VLDL Cholesterol Cal: 25 mg/dL (ref 5–40)

## 2021-12-24 LAB — CBC WITH DIFFERENTIAL/PLATELET
Basophils Absolute: 0.1 10*3/uL (ref 0.0–0.2)
Basos: 1 %
EOS (ABSOLUTE): 0.4 10*3/uL (ref 0.0–0.4)
Eos: 4 %
Hematocrit: 42.8 % (ref 37.5–51.0)
Hemoglobin: 15 g/dL (ref 13.0–17.7)
Immature Grans (Abs): 0 10*3/uL (ref 0.0–0.1)
Immature Granulocytes: 0 %
Lymphocytes Absolute: 3.6 10*3/uL — ABNORMAL HIGH (ref 0.7–3.1)
Lymphs: 39 %
MCH: 32.5 pg (ref 26.6–33.0)
MCHC: 35 g/dL (ref 31.5–35.7)
MCV: 93 fL (ref 79–97)
Monocytes Absolute: 0.8 10*3/uL (ref 0.1–0.9)
Monocytes: 9 %
Neutrophils Absolute: 4.4 10*3/uL (ref 1.4–7.0)
Neutrophils: 47 %
Platelets: 212 10*3/uL (ref 150–450)
RBC: 4.61 x10E6/uL (ref 4.14–5.80)
RDW: 12.9 % (ref 11.6–15.4)
WBC: 9.2 10*3/uL (ref 3.4–10.8)

## 2021-12-24 LAB — CMP14+EGFR
ALT: 13 IU/L (ref 0–44)
AST: 16 IU/L (ref 0–40)
Albumin/Globulin Ratio: 1.3 (ref 1.2–2.2)
Albumin: 3.9 g/dL (ref 3.8–4.9)
Alkaline Phosphatase: 91 IU/L (ref 44–121)
BUN/Creatinine Ratio: 11 (ref 9–20)
BUN: 13 mg/dL (ref 6–24)
Bilirubin Total: 0.5 mg/dL (ref 0.0–1.2)
CO2: 23 mmol/L (ref 20–29)
Calcium: 9 mg/dL (ref 8.7–10.2)
Chloride: 107 mmol/L — ABNORMAL HIGH (ref 96–106)
Creatinine, Ser: 1.17 mg/dL (ref 0.76–1.27)
Globulin, Total: 3 g/dL (ref 1.5–4.5)
Glucose: 86 mg/dL (ref 70–99)
Potassium: 3.9 mmol/L (ref 3.5–5.2)
Sodium: 142 mmol/L (ref 134–144)
Total Protein: 6.9 g/dL (ref 6.0–8.5)
eGFR: 73 mL/min/{1.73_m2} (ref >59)

## 2021-12-29 ENCOUNTER — Other Ambulatory Visit: Payer: Self-pay | Admitting: Family Medicine

## 2021-12-29 DIAGNOSIS — I77810 Thoracic aortic ectasia: Secondary | ICD-10-CM

## 2022-02-01 ENCOUNTER — Other Ambulatory Visit: Payer: Self-pay | Admitting: Internal Medicine

## 2022-02-01 DIAGNOSIS — F5105 Insomnia due to other mental disorder: Secondary | ICD-10-CM

## 2022-03-02 NOTE — Progress Notes (Signed)
      [address]       [place] [address]             [phone]        Hale Chalfin [id number] 04/21/1966   History of Present Illness:  Garrett Knight is a 56 yo male with known history of Genital Warts, HIV, Depression, Previous Substance abuse, COPD, Nicotine abuse, and newly diagnosed Ascending Aortic Aneurysm.  The patient recently underwent lung cancer screening CT scan back in August.  He was found to have an incidental 4.2 cm Ascending aortic aneurysm.  He was referred by his PCP for surgical evaluation/surveillance.     Current Outpatient Medications on File Prior to Visit  Medication Sig Dispense Refill   albuterol (VENTOLIN HFA) 108 (90 Base) MCG/ACT inhaler Inhale 2 puffs into the lungs every 6 (six) hours as needed for wheezing or shortness of breath. 1 each 3   bictegravir-emtricitabine-tenofovir AF (BIKTARVY) 50-200-25 MG TABS tablet Take 1 tablet by mouth daily. 7 tablet 0   umeclidinium-vilanterol (ANORO ELLIPTA) 62.5-25 MCG/ACT AEPB Inhale 1 puff into the lungs daily. 7 each 0   No current facility-administered medications on file prior to visit.     There were no vitals taken for this visit.  Physical Exam  CTA Results:      A/P:  NO SHOWED APPOINTMENT   Jedadiah Abdallah, PA-C 03/02/22

## 2022-03-02 NOTE — Patient Instructions (Signed)
Smoking increases your risk of Aortic Dilatation and Dissection, consider stopping   Make every effort to maintain a "heart-healthy" lifestyle with regular physical exercise and adherence to a low-fat, low-carbohydrate diet.  Continue to seek regular follow-up appointments with your primary care physician and/or cardiologist.  AVOID FLUOROQUINOLONES (ex: Ciprofloxacin) as these antibiotics can increase your risk of Aortic Dissection

## 2022-03-05 ENCOUNTER — Institutional Professional Consult (permissible substitution): Payer: Self-pay

## 2022-03-05 ENCOUNTER — Encounter: Payer: Self-pay | Admitting: Thoracic Surgery (Cardiothoracic Vascular Surgery)

## 2022-03-20 ENCOUNTER — Ambulatory Visit: Payer: 59 | Admitting: Student

## 2022-04-03 NOTE — Patient Instructions (Incomplete)
Make every effort to maintain a "heart-healthy" lifestyle with regular physical exercise and adherence to a low-fat, low-carbohydrate diet.  Continue to seek regular follow-up appointments with your primary care physician and/or cardiologist.  Stop smoking immediately and permanently.  AVOID FLOUROQUINOLONES AS THESE INCREASE YOUR RISK OF DISSECTION

## 2022-04-03 NOTE — Progress Notes (Deleted)
      [address]       [place] [address]             [phone]        Garrett Knight [id number] 10/15/1965  History of Present Illness:   Garrett Knight is a 56 yo male with known history of Genital Warts, HIV, Depression, Previous Substance abuse, COPD, Nicotine abuse, and newly diagnosed Ascending Aortic Aneurysm.  The patient recently underwent lung cancer screening CT scan back in August.  He was found to have an incidental 4.2 cm Ascending aortic aneurysm.  He was referred by his PCP for surgical evaluation/surveillance. He was scheduled back in August for evaluation, however the patient did not show up for his appointment.    Current Outpatient Medications on File Prior to Visit  Medication Sig Dispense Refill   albuterol (VENTOLIN HFA) 108 (90 Base) MCG/ACT inhaler Inhale 2 puffs into the lungs every 6 (six) hours as needed for wheezing or shortness of breath. 1 each 3   bictegravir-emtricitabine-tenofovir AF (BIKTARVY) 50-200-25 MG TABS tablet Take 1 tablet by mouth daily. 7 tablet 0   umeclidinium-vilanterol (ANORO ELLIPTA) 62.5-25 MCG/ACT AEPB Inhale 1 puff into the lungs daily. 7 each 0   No current facility-administered medications on file prior to visit.     There were no vitals taken for this visit.  Physical Exam  CTA Results:      A/P:      Risk Modification:  Statin:  ***  Smoking cessation instruction/counseling given:  {CHL AMB [facility] SMOKING CESSATION COUNSELING:20758}  Patient was counseled on importance of Blood Pressure Control.  Despite Medical intervention if the patient notices persistently elevated blood pressure readings.  They are instructed to contact their Primary Care Physician  Please avoid use of Fluoroquinolones as this can potentially increase your risk of Aortic Rupture and/or Dissection  Patient educated on signs and symptoms of Aortic Dissection, handout also provided in AVS  Bohden Dung,  PA-C 04/03/22

## 2022-04-06 ENCOUNTER — Encounter: Payer: Self-pay | Admitting: Thoracic Surgery (Cardiothoracic Vascular Surgery)

## 2022-04-27 IMAGING — DX DG CHEST 2V
2 series · 2 of 2 positions shown · non-contrast
Comparison: None.

CLINICAL DATA: Dyspnea, history of tobacco use

EXAM:
CHEST - 2 VIEW

[chest pa]
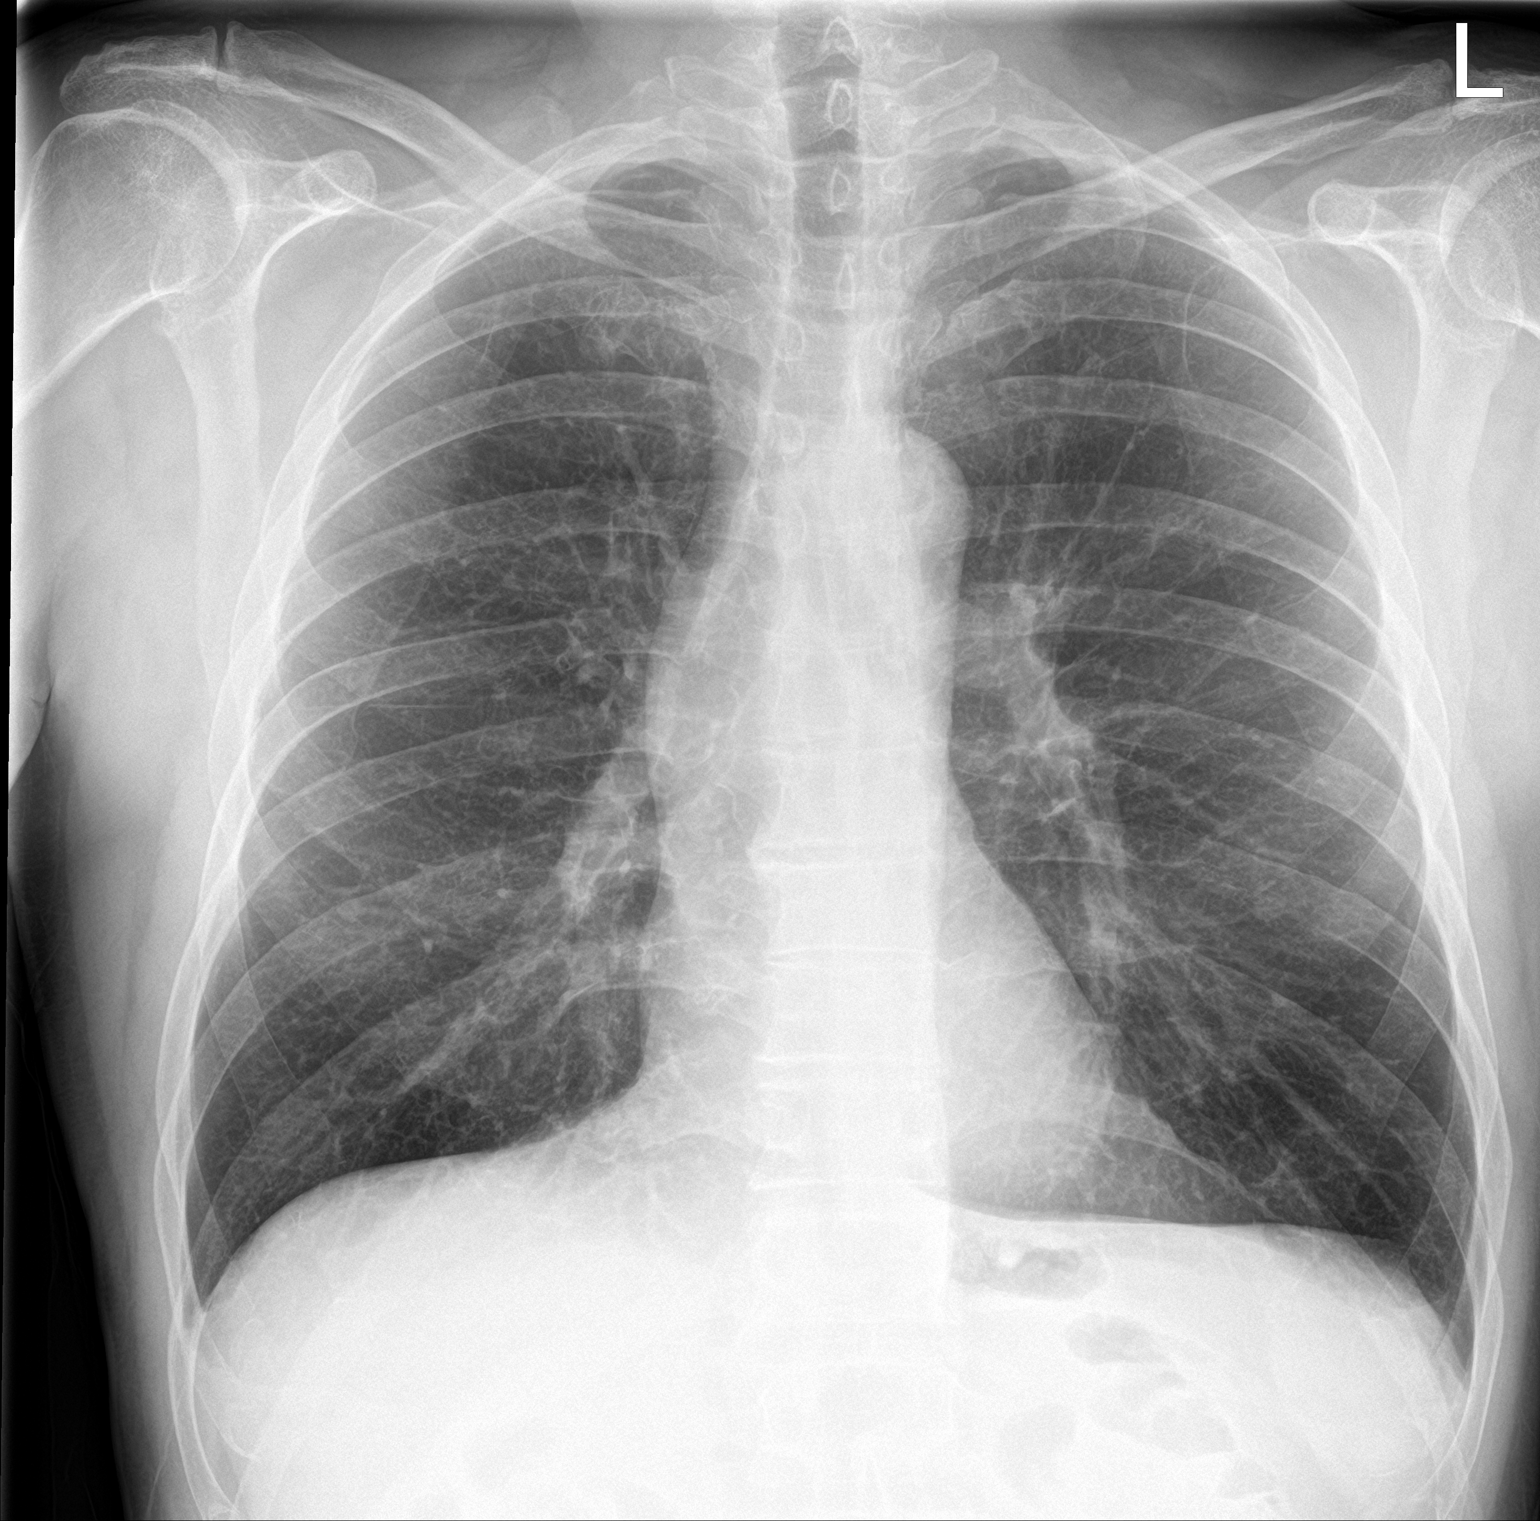

[chest lat]
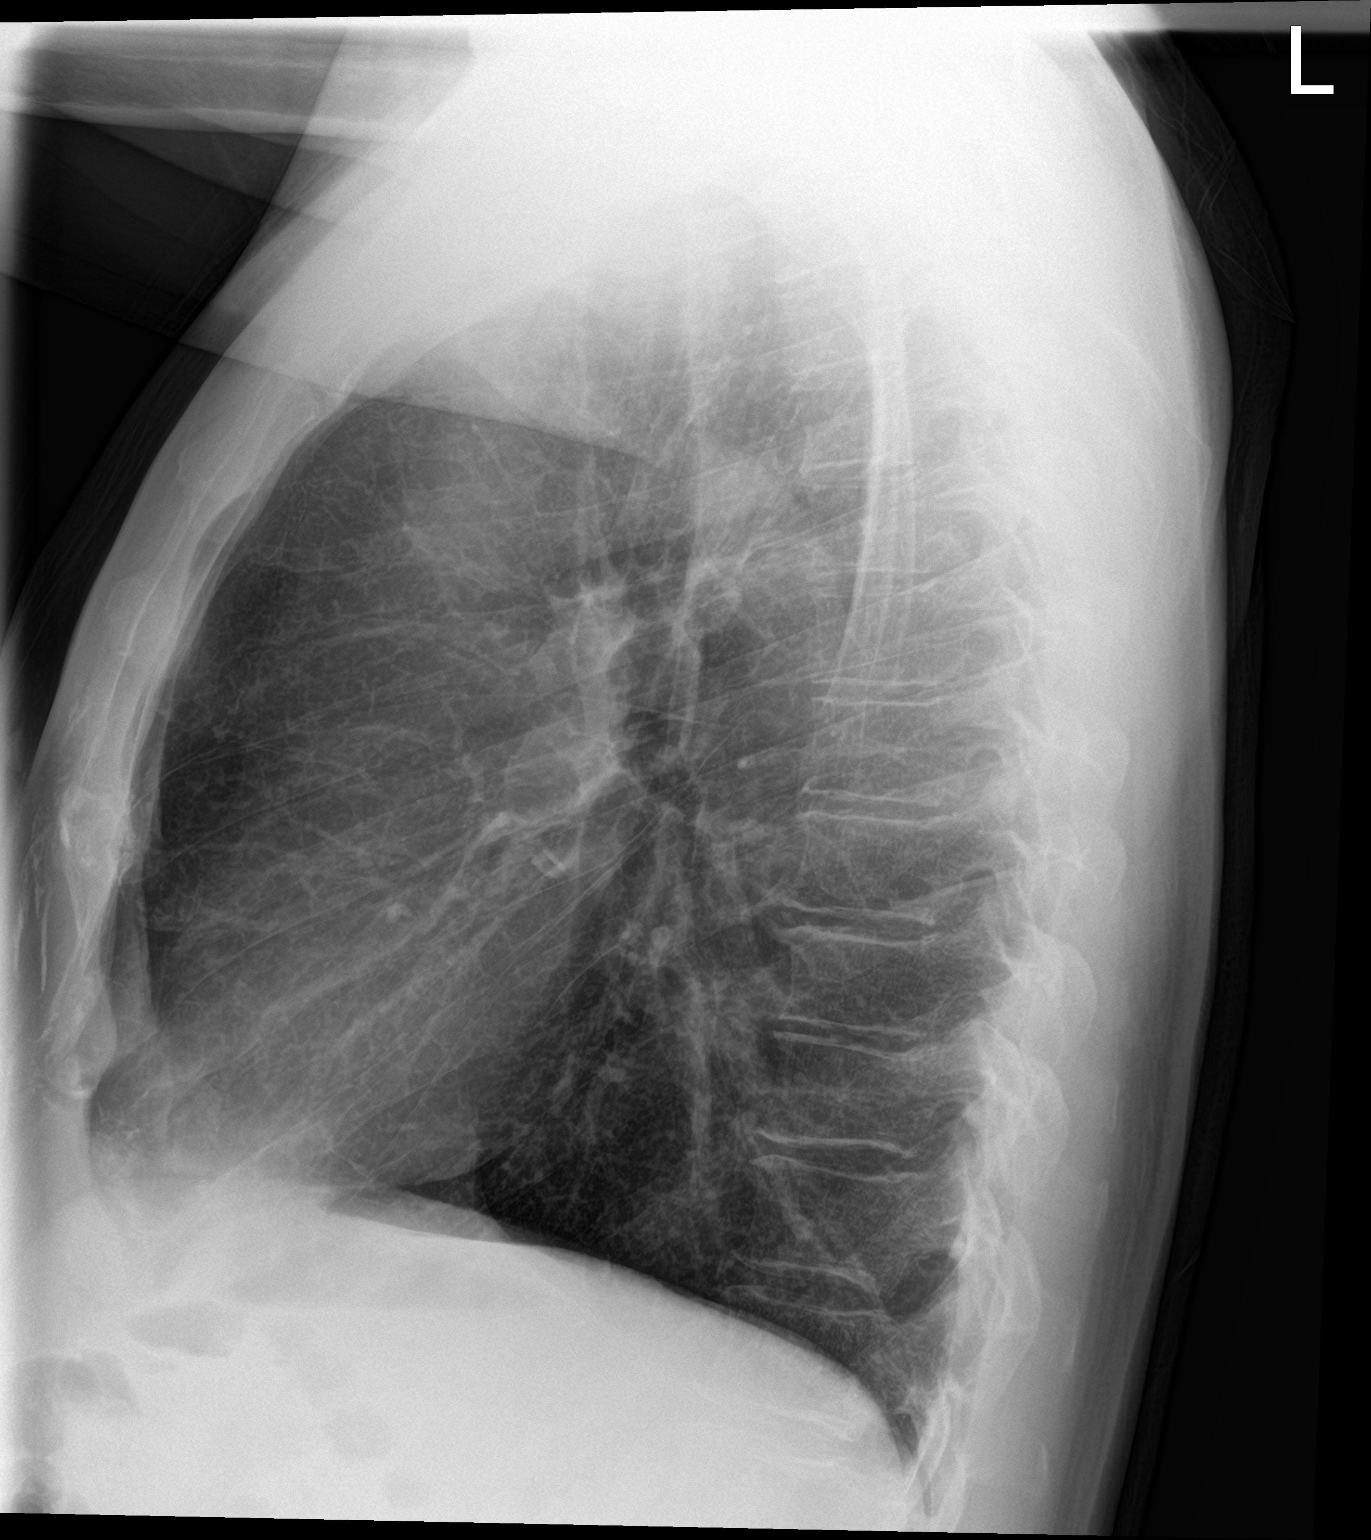

[2 of 2 positions shown; findings below may reference images not displayed]

FINDINGS: The cardiomediastinal silhouette is normal.

There is no focal consolidation or pulmonary edema. There is no
pleural effusion or pneumothorax

There is no acute osseous abnormality.
IMPRESSION: No radiographic evidence of acute cardiopulmonary process.

## 2022-06-25 ENCOUNTER — Ambulatory Visit: Payer: Self-pay | Attending: Family Medicine | Admitting: Family Medicine

## 2022-06-25 ENCOUNTER — Encounter: Payer: Self-pay | Admitting: Family Medicine

## 2022-06-25 VITALS — BP 107/67 | HR 60 | Ht 70.0 in | Wt 186.0 lb

## 2022-06-25 DIAGNOSIS — Z1211 Encounter for screening for malignant neoplasm of colon: Secondary | ICD-10-CM

## 2022-06-25 DIAGNOSIS — I77819 Aortic ectasia, unspecified site: Secondary | ICD-10-CM

## 2022-06-25 DIAGNOSIS — Z91148 Patient's other noncompliance with medication regimen for other reason: Secondary | ICD-10-CM

## 2022-06-25 DIAGNOSIS — J438 Other emphysema: Secondary | ICD-10-CM

## 2022-06-25 MED ORDER — ALBUTEROL SULFATE HFA 108 (90 BASE) MCG/ACT IN AERS: 2.0000 | INHALATION_SPRAY | Freq: Four times a day (QID) | RESPIRATORY_TRACT | 3 refills | Status: DC | PRN

## 2022-06-25 MED ORDER — ANORO ELLIPTA 62.5-25 MCG/ACT IN AEPB: 1.0000 | INHALATION_SPRAY | Freq: Every day | RESPIRATORY_TRACT | 6 refills | Status: DC

## 2022-06-25 NOTE — Progress Notes (Signed)
Subjective:  Patient ID: Garrett Knight, male    DOB: [birth year]  Age: 57 y.o. MRN: [id number]  CC: Shortness of Breath   HPI Garrett Knight is a 57 y.o. year old male with a history of HIV, tobacco abuse, COPD .  Interval History:  "Aside that I can't breathe I don't know what to say". He says he uses his inhalers occasionally. States the other day he lifted a bag of concrete and felt like he was about to die.  Adherence with medications have always been a challenge.  He also is under pulmonary care with his last visit in 11/2021. Smokes 1 pack/day and is not ready to quit.  CT lung cancer screen from [facility], 09/2021: FINDINGS:   LUNG NODULES: Right upper lobe solid nodule that measures 5 mm (series 3,  180). Left upper lobe solid nodule that measures 3 mm (series 3, 189.  Additional micronodules marked on series 3.   LUNGS: Biapical pleural parenchymal scarring. Mild centrilobular  emphysema. Diffuse airway thickening. Adherent secretions within the  trachea and left main bronchus. Mild central bronchiectasis. No focal  consolidation.   PLEURA: No pleural thickening or effusion.   HEART: Heart size normal. No pericardial effusion. Mild ectasia of the  ascending aorta measuring up to 4.2 cm. Aortic calcifications.   CORONARY ARTERY CALCIFICATION: Minimal.   MEDIASTINUM/HILUM/AXILLA: Nonspecific prominent subcentimeter mediastinal  and left hilar lymph nodes.   OTHER FINDINGS: Cholelithiasis. Multilevel spondylosis. No acute osseous  abnormalities or suspicious osseous lesions.    I had referred him to cardiothoracic surgery due to his ascending aortic ectasia but somehow he was never scheduled with them. Past Medical History:  Diagnosis Date   Depression 10/17/2020   Emphysema lung ([place]) 12/23/2021   High cholesterol    Shortness of breath 05/14/2021   Substance abuse ([place])     Past Surgical History:  Procedure Laterality Date   APPENDECTOMY      LAPAROSCOPIC APPENDECTOMY  10/25/2011   Procedure: APPENDECTOMY LAPAROSCOPIC;  Surgeon: Madilyn Hook, DO;  Location: [facility];  Service: General;  Laterality: N/A;    Family History  Problem Relation Age of Onset   Cancer Sister    Diabetes Father    Heart disease Father    Hypothyroidism Father    Hypothyroidism Brother     Social History   Socioeconomic History   Marital status: Divorced    Spouse name: Not on file   Number of children: Not on file   Years of education: Not on file   Highest education level: Not on file  Occupational History   Not on file  Tobacco Use   Smoking status: Every Day    Packs/day: 1.00    Years: 41.00    Total pack years: 41.00    Types: Cigarettes   Smokeless tobacco: Never  Substance and Sexual Activity   Alcohol use: No    Alcohol/week: 0.0 standard drinks of alcohol    Comment: Past history of Cocaine, Marijuana and LSD use    Drug use: No   Sexual activity: Yes    Partners: Male    Birth control/protection: Condom    Comment: accepted condoms  Other Topics Concern   Not on file  Social History Narrative   Not on file   Social Determinants of Health   Financial Resource Strain: Not on file  Food Insecurity: Not on file  Transportation Needs: Not on file  Physical Activity: Not on file  Stress: Not on file  Social Connections: Not on file    No Known Allergies  Outpatient Medications Prior to Visit  Medication Sig Dispense Refill   bictegravir-emtricitabine-tenofovir AF (BIKTARVY) 50-200-25 MG TABS tablet Take 1 tablet by mouth daily. 7 tablet 0   albuterol (VENTOLIN HFA) 108 (90 Base) MCG/ACT inhaler Inhale 2 puffs into the lungs every 6 (six) hours as needed for wheezing or shortness of breath. 1 each 3   umeclidinium-vilanterol (ANORO ELLIPTA) 62.5-25 MCG/ACT AEPB Inhale 1 puff into the lungs daily. 7 each 0   No facility-administered medications prior to visit.     ROS Review of Systems  Constitutional:  Negative  for activity change and appetite change.  HENT:  Negative for sinus pressure and sore throat.   Respiratory:  Positive for shortness of breath. Negative for chest tightness and wheezing.   Cardiovascular:  Negative for chest pain and palpitations.  Gastrointestinal:  Negative for abdominal distention, abdominal pain and constipation.  Genitourinary: Negative.   Musculoskeletal: Negative.   Psychiatric/Behavioral:  Negative for behavioral problems and dysphoric mood.     Objective:  BP 107/67   Pulse 60   Ht 5\' 10"  (1.778 m)   Wt 186 lb (84.4 kg)   SpO2 97%   BMI 26.69 kg/m      06/25/2022   11:35 AM 12/23/2021   10:48 AM 12/19/2021    1:26 PM  BP/Weight  Systolic BP 161 096 045  Diastolic BP 67 64 60  Wt. (Lbs) 186 176.2 179  BMI 26.69 kg/m2 25.28 kg/m2 25.68 kg/m2      Physical Exam Constitutional:      Appearance: He is well-developed.  Cardiovascular:     Rate and Rhythm: Normal rate.     Heart sounds: Normal heart sounds. No murmur heard. Pulmonary:     Effort: Pulmonary effort is normal.     Breath sounds: Normal breath sounds. No wheezing or rales.  Chest:     Chest wall: No tenderness.  Abdominal:     General: Bowel sounds are normal. There is no distension.     Palpations: Abdomen is soft. There is no mass.     Tenderness: There is no abdominal tenderness.  Musculoskeletal:        General: Normal range of motion.     Right lower leg: No edema.     Left lower leg: No edema.  Neurological:     Mental Status: He is alert and oriented to person, place, and time.  Psychiatric:        Mood and Affect: Mood normal.        Latest Ref Rng & Units 12/23/2021   11:36 AM 10/17/2020   10:42 AM 02/01/2020   11:28 AM  CMP  Glucose 70 - 99 mg/dL 86  85  85   BUN 6 - 24 mg/dL 13  11  14    Creatinine 0.76 - 1.27 mg/dL 1.17  1.15  1.14   Sodium 134 - 144 mmol/L 142  139  139   Potassium 3.5 - 5.2 mmol/L 3.9  4.1  4.3   Chloride 96 - 106 mmol/L 107  104  106   CO2  20 - 29 mmol/L 23  30  29    Calcium 8.7 - 10.2 mg/dL 9.0  9.3  8.9   Total Protein 6.0 - 8.5 g/dL 6.9  7.0  7.3   Total Bilirubin 0.0 - 1.2 mg/dL 0.5  0.6  0.5   Alkaline Phos 44 - 121 IU/L 91  AST 0 - 40 IU/L 16  19  18    ALT 0 - 44 IU/L 13  17  16      Lipid Panel     Component Value Date/Time   CHOL 191 12/23/2021 1136   TRIG 136 12/23/2021 1136   HDL 31 (L) 12/23/2021 1136   CHOLHDL 4.8 10/17/2020 1042   VLDL 36 (H) 11/30/2016 0914   LDLCALC 135 (H) 12/23/2021 1136   LDLCALC 122 (H) 10/17/2020 1042    CBC    Component Value Date/Time   WBC 9.2 12/23/2021 1136   WBC 6.9 10/17/2020 1042   RBC 4.61 12/23/2021 1136   RBC 4.65 10/17/2020 1042   HGB 15.0 12/23/2021 1136   HCT 42.8 12/23/2021 1136   PLT 212 12/23/2021 1136   MCV 93 12/23/2021 1136   MCH 32.5 12/23/2021 1136   MCH 32.7 10/17/2020 1042   MCHC 35.0 12/23/2021 1136   MCHC 34.7 10/17/2020 1042   RDW 12.9 12/23/2021 1136   LYMPHSABS 3.6 (H) 12/23/2021 1136   MONOABS 0.7 11/14/2013 1639   EOSABS 0.4 12/23/2021 1136   BASOSABS 0.1 12/23/2021 1136    No results found for: "HGBA1C"  Assessment & Plan:  1. Other emphysema (HCC) Uncontrolled due to medication nonadherence.  I am unsure what his [place] adherence as he just seems to not really care about taking his medications Unfortunately he continues to smoke and is not ready to quit Advised to resume his inhalers again Provided with the number to his pulmonologist for follow-up - albuterol (VENTOLIN HFA) 108 (90 Base) MCG/ACT inhaler; Inhale 2 puffs into the lungs every 6 (six) hours as needed for wheezing or shortness of breath.  Dispense: 1 each; Refill: 3 - umeclidinium-vilanterol (ANORO ELLIPTA) 62.5-25 MCG/ACT AEPB; Inhale 1 puff into the lungs daily.  Dispense: 30 each; Refill: 6 - CMP14+EGFR  2. Nonadherence to medication Counseled that as long as he remains nonadherent his emphysema unfortunate will be uncontrolled with ongoing symptoms  3.  Ectasis aorta (HCC) Incidental finding noted on lung cancer screen from 09/2021 from [facility] - Ambulatory referral to Cardiothoracic Surgery  4. Screening for colon cancer - Fecal occult blood, imunochemical([facility])    Meds ordered this encounter  Medications   albuterol (VENTOLIN HFA) 108 (90 Base) MCG/ACT inhaler    Sig: Inhale 2 puffs into the lungs every 6 (six) hours as needed for wheezing or shortness of breath.    Dispense:  1 each    Refill:  3   umeclidinium-vilanterol (ANORO ELLIPTA) 62.5-25 MCG/ACT AEPB    Sig: Inhale 1 puff into the lungs daily.    Dispense:  30 each    Refill:  6    Order Specific Question:   Lot Number?    Answer:   [id number]    Order Specific Question:   Expiration Date?    Answer:   04/01/2023    Order Specific Question:   Quantity    Answer:   2    Follow-up: Return in about 6 months (around 12/24/2022) for Chronic medical conditions.       04/03/2023, MD, FAAFP. [facility] [place], [facility] [place]   06/25/2022, 12:32 PM

## 2022-06-25 NOTE — Patient Instructions (Addendum)
[address], [place], [place] [address] Hours:  Open ? Closes 5?PM Phone: [phone]    Chronic Obstructive Pulmonary Disease  Chronic obstructive pulmonary disease (COPD) is a long-term (chronic) condition that affects the lungs. COPD is a general term that can be used to describe many different lung problems that cause lung inflammation and limit airflow, including chronic bronchitis and emphysema. If you have COPD, your lung function will probably never return to normal. In most cases, it gets worse over time. However, there are steps you can take to slow the progression of the disease and improve your quality of life. What are the causes? This condition may be caused by: Smoking. This is the most common cause. Certain genes passed down through families. What increases the risk? The following factors may make you more likely to develop this condition: Being exposed to secondhand smoke from cigarettes, pipes, or cigars. Being exposed to chemicals and other irritants, such as fumes and dust in the work environment. Having chronic lung conditions or infections. What are the signs or symptoms? Symptoms of this condition include: Shortness of breath, especially during physical activity. Chronic cough with a large amount of thick mucus. Sometimes, the cough may not have any mucus (dry cough). Wheezing and rapid breathing. Gray or bluish discoloration (cyanosis) of the skin, especially in the fingers, toes, or lips. Feeling tired (fatigue). Weight loss. Chest tightness. Frequent infections. Episodes when breathing symptoms become much worse (exacerbations). At the later stages of this disease, you may have swelling in the ankles, feet, or legs. How is this diagnosed? This condition is diagnosed based on: Your medical history. A physical exam. You may also have tests, including: Lung (pulmonary) function tests. This may include a spirometry test, which measures your ability  to exhale properly. Chest X-ray. CT scan. Blood tests. How is this treated? This condition may be treated with: Medicines. These may include inhaled rescue medicines to treat acute exacerbations as well as medicines that you take long-term (maintenance medicines) to prevent flare-ups of COPD. Bronchodilators help treat COPD by dilating the airways to allow increased airflow and make your breathing more comfortable. Steroids can reduce airway inflammation and help prevent exacerbations. Smoking cessation. If you smoke, your health care provider may ask you to quit, and may also recommend therapy or replacement products to help you quit. Pulmonary rehabilitation. This may involve working with a team of health care providers and specialists, such as respiratory, occupational, and physical therapists. Exercise and physical activity. These are beneficial for nearly all people with COPD. Nutrition therapy to gain weight, if you are underweight. Oxygen. Supplemental oxygen therapy is only helpful if you have a low oxygen level in your blood (hypoxemia). Lung surgery or transplant. Palliative care. This is to help people with COPD feel comfortable when treatment is no longer working. Follow these instructions at home: Medicines Take over-the-counter and prescription medicines only as told by your health care provider. This includes inhaled medicines and pills. Talk to your health care provider before taking any cough or allergy medicines. You may need to avoid certain medicines that dry out your airways. Lifestyle If you smoke, the most important thing that you can do is to stop smoking. Continuing to smoke will cause the disease to progress faster. Do not use any products that contain nicotine or tobacco. These products include cigarettes, chewing tobacco, and vaping devices, such as e-cigarettes. If you need help quitting, ask your health care provider. Avoid exposure to things that irritate  your  lungs, such as smoke, chemicals, and fumes. Stay active, but balance activity with periods of rest. Exercise and physical activity will help you maintain your ability to do things you want to do. Learn and use relaxation techniques to manage stress and to control your breathing. Get the right amount of sleep and get quality sleep. Most adults need 7 or more hours per night. Eat healthy foods. Eating smaller, more frequent meals and resting before meals may help you maintain your strength. Controlled breathing Learn and use controlled breathing techniques as directed by your health care provider. Controlled breathing techniques include: Pursed lip breathing. Start by breathing in (inhaling) through your nose for 1 second. Then, purse your lips as if you were going to whistle and breathe out (exhale) through the pursed lips for 2 seconds. Diaphragmatic breathing. Start by putting one hand on your abdomen just above your waist. Inhale slowly through your nose. The hand on your abdomen should move out. Then purse your lips and exhale slowly. You should be able to feel the hand on your abdomen moving in as you exhale.  Controlled coughing Learn and use controlled coughing to clear mucus from your lungs. Controlled coughing is a series of short, progressive coughs. The steps of controlled coughing are: Lean your head slightly forward. Breathe in deeply using diaphragmatic breathing. Try to hold your breath for 3 seconds. Keep your mouth slightly open while coughing twice. Spit any mucus out into a tissue. Rest and repeat the steps once or twice as needed. General instructions Make sure you receive all the vaccines that your health care provider recommends, especially the pneumococcal and influenza vaccines. Preventing infection and hospitalization is very important when you have COPD. Drink enough fluid to keep your urine pale yellow, unless you have a medical condition that requires fluid  restriction. Use oxygen therapy and pulmonary rehabilitation if told by your health care provider. If you require home oxygen therapy, ask your health care provider whether you should purchase a pulse oximeter to measure your oxygen level at home. Work with your health care provider to develop a COPD action plan. This will help you know what steps to take if your condition gets worse. Keep other chronic health conditions under control as told by your health care provider. Avoid extreme temperature and humidity changes. Avoid contact with people who have an illness that spreads from person to person (is contagious), such as viral infections or pneumonia. Keep all follow-up visits. This is important. Contact a health care provider if: You are coughing up more mucus than usual. There is a change in the color or thickness of your mucus. Your breathing is more labored than usual. Your breathing is faster than usual. You have difficulty sleeping. You need to use your rescue medicines or inhalers more often than expected. You have trouble doing routine activities such as getting dressed or walking around the house. Get help right away if: You have shortness of breath while you are resting. You have shortness of breath that prevents you from: Being able to talk. Performing your usual physical activities. You have chest pain lasting longer than 5 minutes. Your skin color is more blue (cyanotic) than usual. You measure low oxygen saturations for longer than 5 minutes with a pulse oximeter. You have a fever. You feel too tired to breathe normally. These symptoms may represent a serious problem that is an emergency. Do not wait to see if the symptoms will go away. Get medical help right  away. Call your local emergency services (911 in the [place] Do not drive yourself to the hospital. Summary Chronic obstructive pulmonary disease (COPD) is a long-term (chronic) condition that affects the lungs. Your  lung function will probably never return to normal. In most cases, it gets worse over time. However, there are steps you can take to slow the progression of the disease and improve your quality of life. Treatment for COPD may include taking medicines, quitting smoking, pulmonary rehabilitation, and changes to diet and exercise. As the disease progresses, you may need oxygen therapy, a lung transplant, or palliative care. To help manage your condition, do not smoke, avoid exposure to things that irritate your lungs, stay up to date on all vaccines, and follow your health care provider's instructions for taking medicines. This information is not intended to replace advice given to you by your health care provider. Make sure you discuss any questions you have with your health care provider. Document Revised: 03/26/2020 Document Reviewed: 03/26/2020 Elsevier Patient Education  2023 [employer].

## 2022-06-26 LAB — CMP14+EGFR
ALT: 23 IU/L (ref 0–44)
AST: 23 IU/L (ref 0–40)
Albumin/Globulin Ratio: 1.4 (ref 1.2–2.2)
Albumin: 4 g/dL (ref 3.8–4.9)
Alkaline Phosphatase: 71 IU/L (ref 44–121)
BUN/Creatinine Ratio: 13 (ref 9–20)
BUN: 15 mg/dL (ref 6–24)
Bilirubin Total: 0.3 mg/dL (ref 0.0–1.2)
CO2: 24 mmol/L (ref 20–29)
Calcium: 9.4 mg/dL (ref 8.7–10.2)
Chloride: 104 mmol/L (ref 96–106)
Creatinine, Ser: 1.17 mg/dL (ref 0.76–1.27)
Globulin, Total: 2.8 g/dL (ref 1.5–4.5)
Glucose: 77 mg/dL (ref 70–99)
Potassium: 4.1 mmol/L (ref 3.5–5.2)
Sodium: 141 mmol/L (ref 134–144)
Total Protein: 6.8 g/dL (ref 6.0–8.5)
eGFR: 73 mL/min/{1.73_m2} (ref >59)

## 2022-07-16 NOTE — Progress Notes (Deleted)
[address]       [place] [address]             [phone]   PCP is Charlott Rakes, MD Referring Provider is Charlott Rakes, MD  Chief Complaint: Ascending thoracic aortic aneurysm   HPI: This is 57 year old male with a past medical history of substance abuse, high cholesterol, depression, genital Warts, HIV,  COPD, nicotine abuse who was incidentally found to have an ascending thoracic aortic aneurysm.    Past Medical History:  Diagnosis Date   Depression 10/17/2020   Emphysema lung ([place]) 12/23/2021   High cholesterol    Shortness of breath 05/14/2021   Substance abuse ([place])     Past Surgical History:  Procedure Laterality Date   APPENDECTOMY     LAPAROSCOPIC APPENDECTOMY  10/25/2011   Procedure: APPENDECTOMY LAPAROSCOPIC;  Surgeon: Madilyn Hook, DO;  Location: [facility];  Service: General;  Laterality: N/A;    Family History  Problem Relation Age of Onset   Cancer Sister    Diabetes Father    Heart disease Father    Hypothyroidism Father    Hypothyroidism Brother     Social History Social History   Tobacco Use   Smoking status: Every Day    Packs/day: 1.00    Years: 41.00    Total pack years: 41.00    Types: Cigarettes   Smokeless tobacco: Never  Substance Use Topics   Alcohol use: No    Alcohol/week: 0.0 standard drinks of alcohol    Comment: Past history of Cocaine, Marijuana and LSD use    Drug use: No    Current Outpatient Medications  Medication Sig Dispense Refill   albuterol (VENTOLIN HFA) 108 (90 Base) MCG/ACT inhaler Inhale 2 puffs into the lungs every 6 (six) hours as needed for wheezing or shortness of breath. 1 each 3   bictegravir-emtricitabine-tenofovir AF (BIKTARVY) 50-200-25 MG TABS tablet Take 1 tablet by mouth daily. 7 tablet 0   umeclidinium-vilanterol (ANORO ELLIPTA) 62.5-25 MCG/ACT AEPB Inhale 1 puff into the lungs daily. 30 each 6  Allergies: No Known Allergies  Review of Systems:  Chest Pain [  ]  Resting SOB [ ]$  Exertional SOB [  ]  Pedal Edema [  ] Syncope [  ] Presyncope [  ]  General Review of Systems: [Y] = yes [ N]=no  Consitutional:   nausea [ ]$ ;  fever [ ]$ ;  Eye : blurred vision [ ]$ ; Amaurosis fugax[  ];  Resp: cough [ ]$ ;  hemoptysis[ ]$ ;  GI: vomiting[ ]$ ; melena[ ]$ ; hematochezia []$ ;  GU:hematuria[ ]$ ; Musculoskeletal: myalgias[ ]$ ; joint swelling[  ]; joint erythema[ ]$ ;  Heme/Lymph: anemia[ ]$ ;  Neuro: TIA[ ]$ ;stroke[ ]$ ;  seizures[ ]$ ;  Endocrine: diabetes[ ]$ ;   Vital Signs:  Physical Exam CV Neck Pulmonary Abdomen Extremities Neurologic  Diagnostic Tests:    Risk Modification in those with ascending thoracic aortic aneurysm:  Continue good control of blood pressure (prefer SBP 130/80 or less)  2. Avoid fluoroquinolone antibiotics (I.e Ciprofloxacin, Avelox, Levofloxacin, Ofloxacin)  3.  Use of statin (to decrease cardiovascular risk)  4.  Exercise and activity limitations is individualized, but in general, contact sports are to be  avoided and one should avoid heavy lifting (defined as half of ideal body weight) and exercises involving sustained Valsalva maneuver.  5. Counseling for those suspected of having genetically mediated disease. First-degree relatives of those with TAA disease should be screened as well as those who have  a connective tissue disease (I.e with Marfan syndrome, Ehlers-Danlos syndrome,  and Loeys-Dietz syndrome) or a  bicuspid aortic valve,have an increased risk for  complications related to TAA  6. If one has tobacco abuse, smoking cessation is highly encouraged.   Impression and Plan: *** with a *** cm ascending aortic aneurysm.  Echocardiogram shows a *** valve without evidence of regurgitation.  We discussed the natural history and and risk factors for growth of ascending aortic aneurysms.  We covered the importance of smoking cessation, tight blood pressure control, refraining from lifting heavy objects, and avoiding  fluoroquinolones.  The patient is aware of signs and symptoms of aortic dissection and when to present to the emergency department.  We will continue surveillance and a repeat ** was ordered for ***.     Nani Skillern, PA-C [facility] [phone]

## 2022-07-20 ENCOUNTER — Other Ambulatory Visit: Payer: Self-pay

## 2022-07-20 DIAGNOSIS — B20 Human immunodeficiency virus [HIV] disease: Secondary | ICD-10-CM

## 2022-07-20 MED ORDER — BIKTARVY 50-200-25 MG PO TABS: 1.0000 | ORAL_TABLET | Freq: Every day | ORAL | 0 refills | Status: DC

## 2022-07-21 DIAGNOSIS — R911 Solitary pulmonary nodule: Secondary | ICD-10-CM | POA: Insufficient documentation

## 2022-07-21 DIAGNOSIS — I7121 Aneurysm of the ascending aorta, without rupture: Secondary | ICD-10-CM | POA: Insufficient documentation

## 2022-07-22 ENCOUNTER — Other Ambulatory Visit: Payer: Self-pay

## 2022-07-22 ENCOUNTER — Encounter: Payer: Self-pay | Admitting: Internal Medicine

## 2022-07-22 ENCOUNTER — Other Ambulatory Visit (HOSPITAL_COMMUNITY): Payer: Self-pay

## 2022-07-22 ENCOUNTER — Ambulatory Visit (INDEPENDENT_AMBULATORY_CARE_PROVIDER_SITE_OTHER): Payer: 59 | Admitting: Internal Medicine

## 2022-07-22 VITALS — BP 112/61 | HR 50 | Temp 98.3°F | Wt 187.0 lb

## 2022-07-22 DIAGNOSIS — R911 Solitary pulmonary nodule: Secondary | ICD-10-CM | POA: Diagnosis not present

## 2022-07-22 DIAGNOSIS — F32A Depression, unspecified: Secondary | ICD-10-CM

## 2022-07-22 DIAGNOSIS — J438 Other emphysema: Secondary | ICD-10-CM

## 2022-07-22 DIAGNOSIS — I7121 Aneurysm of the ascending aorta, without rupture: Secondary | ICD-10-CM | POA: Diagnosis not present

## 2022-07-22 DIAGNOSIS — B20 Human immunodeficiency virus [HIV] disease: Secondary | ICD-10-CM

## 2022-07-22 MED ORDER — BIKTARVY 50-200-25 MG PO TABS: 1.0000 | ORAL_TABLET | Freq: Every day | ORAL | 11 refills | Status: DC

## 2022-07-22 NOTE — Progress Notes (Signed)
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   UMECLIDINIUM-VILANTEROL (ANORO ELLIPTA) 62.5-25 MCG/ACT AEPB    Inhale 1 puff into the lungs daily.  Modified Medications   Modified Medication Previous Medication   BICTEGRAVIR-EMTRICITABINE-TENOFOVIR AF (BIKTARVY) 50-200-25 MG TABS TABLET bictegravir-emtricitabine-tenofovir AF (BIKTARVY) 50-200-25 MG TABS tablet      Take 1 tablet by mouth daily.    Take 1 tablet by mouth daily.  Discontinued Medications   No medications on file    Subjective: Garrett Knight is in for his routine HIV follow-up visit.  He denies problems obtaining, taking or tolerating his Biktarvy and does not think he has missed any doses.  He has new insurance and has been reluctant to fill his other medications or keep other doctor visits because he is not sure what his co-pay will be or if those services will be covered.  He continues to smoke cigarettes.  He describes recent episodes of "feeling like he is going to die" when he is exerting himself.  Is very difficult for him to tell me exactly what he is feeling during those episodes but eventually he is able to tell me that he has trouble breathing.  He seems to indicate that he may also have intermittent episodes of exertional chest pressure.   He had a chest CT last August that apparently showed some lung nodules and an ascending aortic aneurysm.  He was referred to vascular surgery last October but missed that appointment.  He saw Dr. Leslye Peer, a pulmonologist, last May has not followed up since.  He says that he has multiple inhalers at home.  He initially said to but then said that he might have 3.  He does not know the names of his inhalers and does not take any of them on a daily basis.  He only uses them on a sporadic basis as needed.  He tells me that he has a follow-up appointment scheduled with Dr. Verlee Monte tomorrow but is not planning on going because he is not sure if the visit will be covered by his insurance.  He remains depressed but is not having as much trouble sleeping as he was last year.  Review of Systems: Review of Systems  Constitutional:  Negative for fever and weight loss.  Respiratory:  Positive for shortness of breath. Negative for cough and sputum production.   Cardiovascular:  Positive for chest pain.  Psychiatric/Behavioral:  Positive for depression. The patient is not nervous/anxious and does not have insomnia.     Past Medical History:  Diagnosis Date   Depression 10/17/2020   Emphysema lung ([place]) 12/23/2021   High cholesterol    Shortness of breath 05/14/2021   Substance abuse ([place])     Social History  Tobacco Use   Smoking status: Every Day    Packs/day: 1.00    Years: 41.00    Total pack years: 41.00    Types: Cigarettes   Smokeless tobacco: Never  Substance Use Topics   Alcohol use: No    Alcohol/week: 0.0 standard drinks of alcohol    Comment: Past history of Cocaine, Marijuana and LSD use    Drug use: No    Family History  Problem Relation Age of Onset   Cancer Sister    Diabetes Father    Heart disease Father    Hypothyroidism Father    Hypothyroidism Brother     No Known Allergies  Health Maintenance  Topic Date Due   Zoster Vaccines- Shingrix (1 of 2) Never done    COLONOSCOPY (Pts 45-23yr Insurance coverage will need to be confirmed)  Never done   INFLUENZA VACCINE  08/30/2022 (Originally 12/30/2021)   Lung Cancer Screening  12/31/2022   DTaP/Tdap/Td (2 - Td or Tdap) 06/08/2027   Hepatitis C Screening  Completed   HIV Screening  Completed   HPV VACCINES  Aged Out   COVID-19 Vaccine  Discontinued    Objective:  Vitals:   07/22/22 1026  BP: 112/61  Pulse: (!) 50  Temp: 98.3 F (36.8 C)  TempSrc: Oral  SpO2: 98%  Weight: 187 lb (84.8 kg)   Body mass index is 26.83 kg/m.  Physical Exam Constitutional:      Comments: He talks very loud and fast, more so than usual today.  He stands frequently and paces in the room during our discussion.  The conversation is somewhat rambling and I have to keep drawing him back to help him focus on my questions.  Cardiovascular:     Rate and Rhythm: Regular rhythm. Tachycardia present.     Heart sounds: No murmur heard. Pulmonary:     Effort: Pulmonary effort is normal.     Breath sounds: No wheezing, rhonchi or rales.  Psychiatric:     Comments: He appears worried and anxious.     Lab Results Lab Results  Component Value Date   WBC 9.2 12/23/2021   HGB 15.0 12/23/2021   HCT 42.8 12/23/2021   MCV 93 12/23/2021   PLT 212 12/23/2021    Lab Results  Component Value Date   CREATININE 1.17 06/25/2022   BUN 15 06/25/2022   NA 141 06/25/2022   K 4.1 06/25/2022   CL 104 06/25/2022   CO2 24 06/25/2022    Lab Results  Component Value Date   ALT 23 06/25/2022   AST 23 06/25/2022   ALKPHOS 71 06/25/2022   BILITOT 0.3 06/25/2022    Lab Results  Component Value Date   CHOL 191 12/23/2021   HDL 31 (L) 12/23/2021   LDLCALC 135 (H) 12/23/2021   TRIG 136 12/23/2021   CHOLHDL 4.8 10/17/2020   Lab Results  Component Value Date   LABRPR REACTIVE (A) 08/13/2021   RPRTITER 1:1 (H) 08/13/2021   HIV 1 RNA Quant (Copies/mL)  Date Value  08/13/2021 Not Detected  04/17/2021 Not Detected   10/17/2020 Not Detected   CD4 T Cell Abs (/uL)  Date Value  04/17/2021 839  10/17/2020 941  02/01/2020 1,002     Problem List Items Addressed This Visit       High   Human immunodeficiency virus (HIV) disease ([place]    His infection has been under excellent, long-term control.  He will get updated lab work today and continue BBoeing  He will follow-up here in 6 months.      Relevant Medications   bictegravir-emtricitabine-tenofovir AF (BIKTARVY) 50-200-25 MG TABS tablet   Other Relevant Orders   T-helper cells (CD4) count (not at [facility])   HIV-1 RNA quant-no reflex-bld   CBC   Comprehensive metabolic panel   RPR   Lipid panel     Unprioritized   Ascending aortic aneurysm ([place])   Depression    He has chronic, moderately severe depression that is unchanged.  Fortunately it does not impact his ability to take his [place] Odor but it certainly does seem to impact other areas of his health care.  On the one hand he can describe being very frightened by his difficulty breathing but he seems paralyzed about what to do about it.  He is currently not willing to see our counselor.      Emphysema lung (HCC)    It sounds like he is having progressive and fairly severe exertional dyspnea.  I am also worried about an anginal equivalent.  I asked him to keep his appointment with Dr. Verlee Monte tomorrow and told him to take all of his inhalers to the visit so he can be clear on what he is supposed to be using and how to use them.  He is currently not prepared to quit smoking cigarettes.      Lung nodule - Primary   Other Visit Diagnoses     HIV disease ([place])       Relevant Medications   bictegravir-emtricitabine-tenofovir AF (BIKTARVY) 50-200-25 MG TABS tablet         Michel Bickers, MD [facility] [phone] pager   [phone] cell 07/22/2022, 12:06 PM

## 2022-07-22 NOTE — Assessment & Plan Note (Signed)
His infection has been under excellent, long-term control.  He will get updated lab work today and continue [employer].  He will follow-up here in 6 months.

## 2022-07-22 NOTE — Assessment & Plan Note (Signed)
He has chronic, moderately severe depression that is unchanged.  Fortunately it does not impact his ability to take his [place] Odor but it certainly does seem to impact other areas of his health care.  On the one hand he can describe being very frightened by his difficulty breathing but he seems paralyzed about what to do about it.  He is currently not willing to see our counselor.

## 2022-07-22 NOTE — Assessment & Plan Note (Signed)
It sounds like he is having progressive and fairly severe exertional dyspnea.  I am also worried about an anginal equivalent.  I asked him to keep his appointment with Dr. Verlee Monte tomorrow and told him to take all of his inhalers to the visit so he can be clear on what he is supposed to be using and how to use them.  He is currently not prepared to quit smoking cigarettes.

## 2022-07-23 LAB — T-HELPER CELLS (CD4) COUNT (NOT AT ARMC)
CD4 % Helper T Cell: 33 % (ref 33–65)
CD4 T Cell Abs: 1129 /uL (ref 400–1790)

## 2022-07-25 LAB — LIPID PANEL
Cholesterol: 204 mg/dL — ABNORMAL HIGH (ref <200)
HDL: 37 mg/dL — ABNORMAL LOW (ref >=40)
LDL Cholesterol (Calc): 140 mg/dL (calc) — ABNORMAL HIGH
Non-HDL Cholesterol (Calc): 167 mg/dL (calc) — ABNORMAL HIGH (ref <130)
Total CHOL/HDL Ratio: 5.5 (calc) — ABNORMAL HIGH (ref <5.0)
Triglycerides: 148 mg/dL (ref <150)

## 2022-07-25 LAB — COMPREHENSIVE METABOLIC PANEL
AG Ratio: 1.3 (calc) (ref 1.0–2.5)
ALT: 19 U/L (ref 9–46)
AST: 18 U/L (ref 10–35)
Albumin: 3.9 g/dL (ref 3.6–5.1)
Alkaline phosphatase (APISO): 70 U/L (ref 35–144)
BUN: 14 mg/dL (ref 7–25)
CO2: 28 mmol/L (ref 20–32)
Calcium: 9.3 mg/dL (ref 8.6–10.3)
Chloride: 107 mmol/L (ref 98–110)
Creat: 1.22 mg/dL (ref 0.70–1.30)
Globulin: 3.1 g/dL (calc) (ref 1.9–3.7)
Glucose, Bld: 85 mg/dL (ref 65–99)
Potassium: 3.9 mmol/L (ref 3.5–5.3)
Sodium: 141 mmol/L (ref 135–146)
Total Bilirubin: 0.5 mg/dL (ref 0.2–1.2)
Total Protein: 7 g/dL (ref 6.1–8.1)

## 2022-07-25 LAB — CBC
HCT: 42.1 % (ref 38.5–50.0)
Hemoglobin: 14.8 g/dL (ref 13.2–17.1)
MCH: 32.5 pg (ref 27.0–33.0)
MCHC: 35.2 g/dL (ref 32.0–36.0)
MCV: 92.3 fL (ref 80.0–100.0)
MPV: 9.5 fL (ref 7.5–12.5)
Platelets: 202 10*3/uL (ref 140–400)
RBC: 4.56 10*6/uL (ref 4.20–5.80)
RDW: 12.4 % (ref 11.0–15.0)
WBC: 7.3 10*3/uL (ref 3.8–10.8)

## 2022-07-25 LAB — RPR: RPR Ser Ql: REACTIVE — AB

## 2022-07-25 LAB — RPR TITER: RPR Titer: 1:1 {titer} — ABNORMAL HIGH

## 2022-07-25 LAB — T PALLIDUM AB: T Pallidum Abs: POSITIVE — AB

## 2022-07-25 LAB — HIV-1 RNA QUANT-NO REFLEX-BLD
HIV 1 RNA Quant: NOT DETECTED Copies/mL
HIV-1 RNA Quant, Log: NOT DETECTED Log cps/mL

## 2022-11-12 NOTE — Progress Notes (Signed)
The 10-year ASCVD risk score (Arnett DK, et al., 2019) is: 12%   Values used to calculate the score:     Age: 57 years     Sex: Male     Is Non-Hispanic African American: No     Diabetic: No     Tobacco smoker: Yes     Systolic Blood Pressure: 112 mmHg     Is BP treated: No     HDL Cholesterol: 37 mg/dL     Total Cholesterol: 204 mg/dL  Sandie Ano, RN

## 2022-12-23 ENCOUNTER — Encounter: Payer: Self-pay | Admitting: Family Medicine

## 2022-12-23 ENCOUNTER — Ambulatory Visit: Payer: 59 | Attending: Family Medicine | Admitting: Family Medicine

## 2022-12-23 VITALS — BP 102/65 | HR 59 | Temp 97.7°F | Ht 70.0 in | Wt 182.0 lb

## 2022-12-23 DIAGNOSIS — Z1211 Encounter for screening for malignant neoplasm of colon: Secondary | ICD-10-CM

## 2022-12-23 DIAGNOSIS — F17210 Nicotine dependence, cigarettes, uncomplicated: Secondary | ICD-10-CM | POA: Diagnosis not present

## 2022-12-23 DIAGNOSIS — Z91148 Patient's other noncompliance with medication regimen for other reason: Secondary | ICD-10-CM

## 2022-12-23 DIAGNOSIS — J438 Other emphysema: Secondary | ICD-10-CM

## 2022-12-23 MED ORDER — ALBUTEROL SULFATE HFA 108 (90 BASE) MCG/ACT IN AERS: 2.0000 | INHALATION_SPRAY | Freq: Four times a day (QID) | RESPIRATORY_TRACT | 3 refills | Status: DC | PRN

## 2022-12-23 MED ORDER — TRELEGY ELLIPTA 100-62.5-25 MCG/ACT IN AEPB: 1.0000 | INHALATION_SPRAY | Freq: Every day | RESPIRATORY_TRACT | 6 refills | Status: DC

## 2022-12-23 NOTE — Progress Notes (Signed)
Subjective:  Patient ID: Garrett Knight, male    DOB: [birth year]  Age: 57 y.o. MRN: [id number]  CC: Emphysema (Emphysema f/u. /No questions/ concerns. )   HPI Garrett Knight is a 57 y.o. year old male with a history of HIV, tobacco abuse, COPD .   Interval History: Discussed the use of AI scribe software for clinical note transcription with the patient, who gave verbal consent to proceed.   He presents with worsening shortness of breath. He reports feeling 'tight in the chest' and needing to take deep breaths 'all day long' to get enough air into their lungs. He has been prescribed albuterol and anoro ellipta inhalers in the past, but has not been using them regularly. He reports that he did not feel any improvement when he tried the inhalers previously, but now feels that his breathing has deteriorated to the point where he 'has to start using them.' He also reports financial difficulties and uncertainty about his insurance coverage, which has led to concerns about affording his medical care, Up with his pulmonologist and medications. He continues to smoke, but has reduced to about eight cigarettes a day and is considering quitting.   He continues to see his infectious disease specialist and is on his antiretroviral medication.       Past Medical History:  Diagnosis Date   Depression 10/17/2020   Emphysema lung (HCC) 12/23/2021   High cholesterol    Shortness of breath 05/14/2021   Substance abuse (H[facility])     Past Surgical History:  Procedure Laterality Date   APPENDECTOMY     LAPAROSCOPIC APPENDECTOMY  10/25/2011   Procedure: APPENDECTOMY LAPAROSCOPIC;  Surgeon: Lodema Pilot, DO;  Location: [facility];  Service: General;  Laterality: N/A;    Family History  Problem Relation Age of Onset   Cancer Sister    Diabetes Father    Heart disease Father    Hypothyroidism Father    Hypothyroidism Brother     Social History   Socioeconomic History   Marital status: Divorced     Spouse name: Not on file   Number of children: Not on file   Years of education: Not on file   Highest education level: Not on file  Occupational History   Not on file  Tobacco Use   Smoking status: Every Day    Current packs/day: 1.00    Average packs/day: 1 pack/day for 41.0 years (41.0 ttl pk-yrs)    Types: Cigarettes   Smokeless tobacco: Never  Substance and Sexual Activity   Alcohol use: No    Alcohol/week: 0.0 standard drinks of alcohol    Comment: Past history of Cocaine, Marijuana and LSD use    Drug use: No   Sexual activity: Yes    Partners: Male    Birth control/protection: Condom    Comment: accepted condoms  Other Topics Concern   Not on file  Social History Narrative   Not on file   Social Determinants of Health   Financial Resource Strain: Not on file  Food Insecurity: Not on file  Transportation Needs: Not on file  Physical Activity: Not on file  Stress: Not on file  Social Connections: Not on file    No Known Allergies  Outpatient Medications Prior to Visit  Medication Sig Dispense Refill   bictegravir-emtricitabine-tenofovir AF (BIKTARVY) 50-200-25 MG TABS tablet Take 1 tablet by mouth daily. 30 tablet 11   albuterol (VENTOLIN HFA) 108 (90 Base) MCG/ACT inhaler Inhale 2 puffs into the lungs every 6 (  six) hours as needed for wheezing or shortness of breath. 1 each 3   umeclidinium-vilanterol (ANORO ELLIPTA) 62.5-25 MCG/ACT AEPB Inhale 1 puff into the lungs daily. 30 each 6   No facility-administered medications prior to visit.     ROS Review of Systems  Constitutional:  Negative for activity change and appetite change.  HENT:  Negative for sinus pressure and sore throat.   Respiratory:  Positive for shortness of breath. Negative for chest tightness and wheezing.   Cardiovascular:  Negative for chest pain and palpitations.  Gastrointestinal:  Negative for abdominal distention, abdominal pain and constipation.  Genitourinary: Negative.    Musculoskeletal: Negative.   Psychiatric/Behavioral:  Negative for behavioral problems and dysphoric mood.     Objective:  BP 102/65 (BP Location: Left Arm, Patient Position: Sitting, Cuff Size: Normal)   Pulse (!) 59   Temp 97.7 F (36.5 C) (Oral)   Ht 5\' 10"  (1.778 m)   Wt 182 lb (82.6 kg)   SpO2 99%   BMI 26.11 kg/m      12/23/2022   11:00 AM 07/22/2022   10:26 AM 06/25/2022   11:35 AM  BP/Weight  Systolic BP 102 112 107  Diastolic BP 65 61 67  Wt. (Lbs) 182 187 186  BMI 26.11 kg/m2 26.83 kg/m2 26.69 kg/m2      Physical Exam Constitutional:      Appearance: He is well-developed.  Cardiovascular:     Rate and Rhythm: Bradycardia present.     Heart sounds: Normal heart sounds. No murmur heard. Pulmonary:     Effort: Pulmonary effort is normal.     Breath sounds: Normal breath sounds. No wheezing or rales.  Chest:     Chest wall: No tenderness.  Abdominal:     General: Bowel sounds are normal. There is no distension.     Palpations: Abdomen is soft. There is no mass.     Tenderness: There is no abdominal tenderness.  Musculoskeletal:        General: Normal range of motion.     Right lower leg: No edema.     Left lower leg: No edema.  Neurological:     Mental Status: He is alert and oriented to person, place, and time.  Psychiatric:        Mood and Affect: Mood normal.        Latest Ref Rng & Units 07/22/2022   11:17 AM 06/25/2022   12:10 PM 12/23/2021   11:36 AM  CMP  Glucose 65 - 99 mg/dL 85  77  86   BUN 7 - 25 mg/dL 14  15  13    Creatinine 0.70 - 1.30 mg/dL 1.61  0.96  0.45   Sodium 135 - 146 mmol/L 141  141  142   Potassium 3.5 - 5.3 mmol/L 3.9  4.1  3.9   Chloride 98 - 110 mmol/L 107  104  107   CO2 20 - 32 mmol/L 28  24  23    Calcium 8.6 - 10.3 mg/dL 9.3  9.4  9.0   Total Protein 6.1 - 8.1 g/dL 7.0  6.8  6.9   Total Bilirubin 0.2 - 1.2 mg/dL 0.5  0.3  0.5   Alkaline Phos 44 - 121 IU/L  71  91   AST 10 - 35 U/L 18  23  16    ALT 9 - 46 U/L 19   23  13      Lipid Panel     Component Value Date/Time  CHOL 204 (H) 07/22/2022 1117   CHOL 191 12/23/2021 1136   TRIG 148 07/22/2022 1117   HDL 37 (L) 07/22/2022 1117   HDL 31 (L) 12/23/2021 1136   CHOLHDL 5.5 (H) 07/22/2022 1117   VLDL 36 (H) 11/30/2016 0914   LDLCALC 140 (H) 07/22/2022 1117    CBC    Component Value Date/Time   WBC 7.3 07/22/2022 1117   RBC 4.56 07/22/2022 1117   HGB 14.8 07/22/2022 1117   HGB 15.0 12/23/2021 1136   HCT 42.1 07/22/2022 1117   HCT 42.8 12/23/2021 1136   PLT 202 07/22/2022 1117   PLT 212 12/23/2021 1136   MCV 92.3 07/22/2022 1117   MCV 93 12/23/2021 1136   MCH 32.5 07/22/2022 1117   MCHC 35.2 07/22/2022 1117   RDW 12.4 07/22/2022 1117   RDW 12.9 12/23/2021 1136   LYMPHSABS 3.6 (H) 12/23/2021 1136   MONOABS 0.7 11/14/2013 1639   EOSABS 0.4 12/23/2021 1136   BASOSABS 0.1 12/23/2021 1136    No results found for: "HGBA1C"  Assessment & Plan:      Chronic Obstructive Pulmonary Disease (COPD): Worsening dyspnea. Patient has been non-compliant with inhaler therapy. Discussed the importance of daily inhaler use for symptom control and prevention of exacerbations. Patient has not been using inhalers correctly or consistently. -Change inhaler to Trelegy for daily use. -Continue Albuterol as needed for acute symptoms. -Educate patient on the correct use of inhalers and the importance of daily use.  Tobacco Use: Patient reports smoking 8 cigarettes per day. Discussed the importance of smoking cessation for overall health and specifically for COPD management. -Encourage patient to continue efforts to quit smoking. -Referred for lung cancer screening  Financial/Insurance Concerns: Patient reports receiving bills and is unsure about insurance coverage. This is impacting the patient's ability to access care and medications. -Advise patient to contact insurance company to clarify coverage and billing issues. -Inform patient about potential  financial assistance programs available through the clinic.  Lung Cancer Screening: Due for lung cancer screening with CT scan. Patient has financial concerns about the cost of the scan. -Order lung cancer screening CT scan. -Advise patient to discuss financial concerns with insurance company and clinic's financial assistance program.          Health Care Maintenance: Referred for colonoscopy for colon cancer screening Meds ordered this encounter  Medications   Fluticasone-Umeclidin-Vilant (TRELEGY ELLIPTA) 100-62.5-25 MCG/ACT AEPB    Sig: Inhale 1 puff into the lungs daily.    Dispense:  1 each    Refill:  6    Discontinue Anoro   albuterol (VENTOLIN HFA) 108 (90 Base) MCG/ACT inhaler    Sig: Inhale 2 puffs into the lungs every 6 (six) hours as needed for wheezing or shortness of breath.    Dispense:  1 each    Refill:  3    Follow-up: Return in about 6 months (around 06/25/2023) for Chronic medical conditions.       Hoy Register, MD, FAAFP. [facility] [place], [place] [phone]   12/23/2022, 12:03 PM

## 2022-12-23 NOTE — Patient Instructions (Signed)

## 2023-01-21 ENCOUNTER — Other Ambulatory Visit: Payer: Self-pay

## 2023-01-21 ENCOUNTER — Ambulatory Visit: Payer: 59

## 2023-01-21 ENCOUNTER — Encounter: Payer: Self-pay | Admitting: Internal Medicine

## 2023-01-21 ENCOUNTER — Ambulatory Visit (INDEPENDENT_AMBULATORY_CARE_PROVIDER_SITE_OTHER): Payer: 59 | Admitting: Internal Medicine

## 2023-01-21 VITALS — BP 106/63 | HR 57 | Temp 97.6°F | Ht 70.0 in | Wt 186.0 lb

## 2023-01-21 DIAGNOSIS — B20 Human immunodeficiency virus [HIV] disease: Secondary | ICD-10-CM | POA: Diagnosis not present

## 2023-01-21 MED ORDER — BIKTARVY 50-200-25 MG PO TABS
1.0000 | ORAL_TABLET | Freq: Every day | ORAL | 11 refills | Status: DC
Start: 2023-01-21 — End: 2023-12-29

## 2023-01-21 NOTE — Assessment & Plan Note (Signed)
He continues to do well on the [place] with no concerns.  No changes indicated.  No concerns.  Labs today and he can return yearly unless issues.   Discussed vaccines and he deferred any vaccines since it 'hurts' or other screening.   I have personally spent 44 minutes involved in face-to-face and non-face-to-face activities for this patient on the day of the visit. Professional time spent includes the following activities: Preparing to see the patient (review of tests), Obtaining and/or reviewing separately obtained history (admission/discharge record), Performing a medically appropriate examination and/or evaluation , Ordering medications/tests/procedures, referring and communicating with other health care professionals, Documenting clinical information in the EMR, Independently interpreting results (not separately reported), Communicating results to the patient/family/caregiver, Counseling and educating the patient/family/caregiver and Care coordination (not separately reported).

## 2023-01-21 NOTE — Progress Notes (Signed)
   Subjective:    Patient ID: Garrett Knight, male    DOB: 12/13/1965, 57 y.o.   MRN: [id number]  HPI Garrett Knight is here for follow up of HIV He continues on [place] and has had no issues with getting or taking his medication.  Has not missed any doses.  He saw his PCP last month and continues to have breathing problems related to his smoking.  Trying to quit.     Review of Systems  Constitutional:  Negative for fatigue.  Gastrointestinal:  Negative for diarrhea and nausea.  Skin:  Negative for rash.       Objective:   Physical Exam Eyes:     General: No scleral icterus. Pulmonary:     Effort: Pulmonary effort is normal.     Comments: At rest Neurological:     Mental Status: He is alert.   SH: + tobacco        Assessment & Plan:

## 2023-01-22 LAB — T-HELPER CELL (CD4) - (RCID CLINIC ONLY)
CD4 % Helper T Cell: 35 % (ref 33–65)
CD4 T Cell Abs: 1051 /uL (ref 400–1790)

## 2023-01-23 LAB — COMPLETE METABOLIC PANEL WITH GFR
AG Ratio: 1.2 (calc) (ref 1.0–2.5)
ALT: 20 U/L (ref 9–46)
AST: 19 U/L (ref 10–35)
Albumin: 3.7 g/dL (ref 3.6–5.1)
Alkaline phosphatase (APISO): 67 U/L (ref 35–144)
BUN: 14 mg/dL (ref 7–25)
CO2: 27 mmol/L (ref 20–32)
Calcium: 9.1 mg/dL (ref 8.6–10.3)
Chloride: 107 mmol/L (ref 98–110)
Creat: 1.19 mg/dL (ref 0.70–1.30)
Globulin: 3 g/dL (ref 1.9–3.7)
Glucose, Bld: 137 mg/dL (ref 65–139)
Potassium: 3.9 mmol/L (ref 3.5–5.3)
Sodium: 140 mmol/L (ref 135–146)
Total Bilirubin: 0.4 mg/dL (ref 0.2–1.2)
Total Protein: 6.7 g/dL (ref 6.1–8.1)
eGFR: 71 mL/min/{1.73_m2} (ref >=60)

## 2023-01-23 LAB — CBC WITH DIFFERENTIAL/PLATELET
Absolute Monocytes: 662 {cells}/uL (ref 200–950)
Basophils Absolute: 69 {cells}/uL (ref 0–200)
Basophils Relative: 0.9 %
Eosinophils Absolute: 354 {cells}/uL (ref 15–500)
Eosinophils Relative: 4.6 %
HCT: 41.7 % (ref 38.5–50.0)
Hemoglobin: 14.2 g/dL (ref 13.2–17.1)
Lymphs Abs: 3296 {cells}/uL (ref 850–3900)
MCH: 32.5 pg (ref 27.0–33.0)
MCHC: 34.1 g/dL (ref 32.0–36.0)
MCV: 95.4 fL (ref 80.0–100.0)
MPV: 9.7 fL (ref 7.5–12.5)
Monocytes Relative: 8.6 %
Neutro Abs: 3319 {cells}/uL (ref 1500–7800)
Neutrophils Relative %: 43.1 %
Platelets: 184 10*3/uL (ref 140–400)
RBC: 4.37 10*6/uL (ref 4.20–5.80)
RDW: 12.7 % (ref 11.0–15.0)
Total Lymphocyte: 42.8 %
WBC: 7.7 10*3/uL (ref 3.8–10.8)

## 2023-01-23 LAB — HIV-1 RNA QUANT-NO REFLEX-BLD
HIV 1 RNA Quant: NOT DETECTED {copies}/mL
HIV-1 RNA Quant, Log: NOT DETECTED {Log_copies}/mL

## 2023-05-14 ENCOUNTER — Other Ambulatory Visit: Payer: Self-pay | Admitting: Family Medicine

## 2023-05-14 DIAGNOSIS — J438 Other emphysema: Secondary | ICD-10-CM

## 2023-05-14 NOTE — Telephone Encounter (Signed)
Requested by interface surescripts. Future visit in 1 month. Requested Prescriptions  Pending Prescriptions Disp Refills   albuterol (VENTOLIN HFA) 108 (90 Base) MCG/ACT inhaler [Pharmacy Med Name: ALBUTEROL HFA (VENTOLIN) INH] 1 each 3    Sig: TAKE 2 PUFFS BY MOUTH EVERY 6 HOURS AS NEEDED FOR WHEEZE OR SHORTNESS OF BREATH     Pulmonology:  Beta Agonists 2 Passed - 05/14/2023 10:09 AM      Passed - Last BP in normal range    BP Readings from Last 1 Encounters:  01/21/23 106/63         Passed - Last Heart Rate in normal range    Pulse Readings from Last 1 Encounters:  01/21/23 (!) 57         Passed - Valid encounter within last 12 months    Recent Outpatient Visits           4 months ago Nonadherence to medication   [facility] [facility] [place] [place]. [facility] Hoy Register, MD   10 months ago Other emphysema [facility])   [place] [facility] Of [place]. [facility] Hoy Register, MD   1 year ago Aortic ectasia, thoracic [facility])   [place] [facility] Of [place]. [facility] Hoy Register, MD   1 year ago Smoking greater than 30 pack years   [place] [facility] [place] [facility] Of [place]. [facility] Hoy Register, MD   1 year ago Suspected sleep apnea   [place] [facility] [place] - [facility] Of [place]. [facility] Hoy Register, MD       Future Appointments             In 1 month Hoy Register, MD [facility] [place] - A Dept Of [place]. [facility]   In 7 months [place], Belia Heman, MD [facility] - A Dept Of . [facility], [place]

## 2023-06-30 ENCOUNTER — Encounter: Payer: Self-pay | Admitting: Family Medicine

## 2023-06-30 ENCOUNTER — Ambulatory Visit: Payer: 59 | Attending: Family Medicine | Admitting: Family Medicine

## 2023-06-30 VITALS — BP 107/62 | HR 52 | Ht 70.0 in | Wt 192.8 lb

## 2023-06-30 DIAGNOSIS — J438 Other emphysema: Secondary | ICD-10-CM

## 2023-06-30 DIAGNOSIS — F17210 Nicotine dependence, cigarettes, uncomplicated: Secondary | ICD-10-CM

## 2023-06-30 DIAGNOSIS — E782 Mixed hyperlipidemia: Secondary | ICD-10-CM

## 2023-06-30 DIAGNOSIS — Z9189 Other specified personal risk factors, not elsewhere classified: Secondary | ICD-10-CM

## 2023-06-30 MED ORDER — ALBUTEROL SULFATE HFA 108 (90 BASE) MCG/ACT IN AERS
2.0000 | INHALATION_SPRAY | Freq: Four times a day (QID) | RESPIRATORY_TRACT | 11 refills | Status: DC | PRN
Start: 2023-06-30 — End: 2023-10-14

## 2023-06-30 MED ORDER — TRELEGY ELLIPTA 100-62.5-25 MCG/ACT IN AEPB: 1.0000 | INHALATION_SPRAY | Freq: Every day | RESPIRATORY_TRACT | 11 refills | Status: AC

## 2023-06-30 NOTE — Progress Notes (Signed)
Subjective:  Patient ID: Garrett Knight, male    DOB: 1965/12/13  Age: 58 y.o. MRN: [id number]  CC: Medical Management of Chronic Issues   HPI Garrett Knight is a 58 y.o. year old male with a history of HIV, tobacco abuse (1 pack/day since the age of 48), COPD .     Interval History: Discussed the use of AI scribe software for clinical note transcription with the patient, who gave verbal consent to proceed.  He presents for a routine follow-up. He reports using his prescribed Trelegy and albuterol inhalers, but is unsure of his effectiveness. He continues to smoke approximately a pack of cigarettes a day, a habit he started at a young age. He expresses a desire to quit smoking, but has not yet committed to doing so. He declines the offer of prescription aids to help with smoking cessation.  The patient also expresses concern about potential exposure to RSV through a coworker, and is taking precautions to avoid getting sick. He has a history of RSV infection, which he describes as a severe and unpleasant experience.  In addition, the patient is seeing an infectious disease specialist for management of his HIV and is compliant with his medication regimen. He reports no new or worsening symptoms.        Past Medical History:  Diagnosis Date   Depression 10/17/2020   Emphysema lung (HCC) 12/23/2021   High cholesterol    Shortness of breath 05/14/2021   Substance abuse (HCC)     Past Surgical History:  Procedure Laterality Date   APPENDECTOMY     LAPAROSCOPIC APPENDECTOMY  10/25/2011   Procedure: APPENDECTOMY LAPAROSCOPIC;  Surgeon: Lodema Pilot, DO;  Location: [facility];  Service: General;  Laterality: N/A;    Family History  Problem Relation Age of Onset   Cancer Sister    Diabetes Father    Heart disease Father    Hypothyroidism Father    Hypothyroidism Brother     Social History   Socioeconomic History   Marital status: Divorced    Spouse name: Not on file   Number  of children: Not on file   Years of education: Not on file   Highest education level: Not on file  Occupational History   Not on file  Tobacco Use   Smoking status: Every Day    Current packs/day: 1.00    Average packs/day: 1 pack/day for 41.0 years (41.0 ttl pk-yrs)    Types: Cigarettes   Smokeless tobacco: Never  Substance and Sexual Activity   Alcohol use: No    Alcohol/week: 0.0 standard drinks of alcohol    Comment: Past history of Cocaine, Marijuana and LSD use    Drug use: No   Sexual activity: Yes    Partners: Male    Birth control/protection: Condom    Comment: accepted condoms  Other Topics Concern   Not on file  Social History Narrative   Not on file   Social Drivers of Health   Financial Resource Strain: Not on file  Food Insecurity: Not on file  Transportation Needs: Not on file  Physical Activity: Not on file  Stress: Not on file  Social Connections: Not on file    No Known Allergies  Outpatient Medications Prior to Visit  Medication Sig Dispense Refill   bictegravir-emtricitabine-tenofovir AF (BIKTARVY) 50-200-25 MG TABS tablet Take 1 tablet by mouth daily. 30 tablet 11   albuterol (VENTOLIN HFA) 108 (90 Base) MCG/ACT inhaler TAKE 2 PUFFS BY MOUTH EVERY 6 HOURS  AS NEEDED FOR WHEEZE OR SHORTNESS OF BREATH 1 each 3   Fluticasone-Umeclidin-Vilant (TRELEGY ELLIPTA) 100-62.5-25 MCG/ACT AEPB Inhale 1 puff into the lungs daily. 1 each 6   No facility-administered medications prior to visit.     ROS Review of Systems  Constitutional:  Negative for activity change and appetite change.  HENT:  Negative for sinus pressure and sore throat.   Respiratory:  Negative for chest tightness, shortness of breath and wheezing.   Cardiovascular:  Negative for chest pain and palpitations.  Gastrointestinal:  Negative for abdominal distention, abdominal pain and constipation.  Genitourinary: Negative.   Musculoskeletal: Negative.   Psychiatric/Behavioral:  Negative for  behavioral problems and dysphoric mood.     Objective:  BP 107/62   Pulse (!) 52   Ht 5\' 10"  (1.778 m)   Wt 192 lb 12.8 oz (87.5 kg)   SpO2 96%   BMI 27.66 kg/m      06/30/2023   10:34 AM 01/21/2023    1:46 PM 12/23/2022   11:00 AM  BP/Weight  Systolic BP 107 106 102  Diastolic BP 62 63 65  Wt. (Lbs) 192.8 186 182  BMI 27.66 kg/m2 26.69 kg/m2 26.11 kg/m2      Physical Exam Constitutional:      Appearance: He is well-developed.  Cardiovascular:     Rate and Rhythm: Bradycardia present.     Heart sounds: Normal heart sounds. No murmur heard. Pulmonary:     Effort: Pulmonary effort is normal.     Breath sounds: Normal breath sounds. No wheezing or rales.  Chest:     Chest wall: No tenderness.  Abdominal:     General: Bowel sounds are normal. There is no distension.     Palpations: Abdomen is soft. There is no mass.     Tenderness: There is no abdominal tenderness.  Musculoskeletal:        General: Normal range of motion.     Right lower leg: No edema.     Left lower leg: No edema.  Neurological:     Mental Status: He is alert and oriented to person, place, and time.  Psychiatric:        Mood and Affect: Mood normal.        Latest Ref Rng & Units 01/21/2023    2:19 PM 07/22/2022   11:17 AM 06/25/2022   12:10 PM  CMP  Glucose 65 - 139 mg/dL 130  85  77   BUN 7 - 25 mg/dL 14  14  15    Creatinine 0.70 - 1.30 mg/dL 8.65  7.84  6.96   Sodium 135 - 146 mmol/L 140  141  141   Potassium 3.5 - 5.3 mmol/L 3.9  3.9  4.1   Chloride 98 - 110 mmol/L 107  107  104   CO2 20 - 32 mmol/L 27  28  24    Calcium 8.6 - 10.3 mg/dL 9.1  9.3  9.4   Total Protein 6.1 - 8.1 g/dL 6.7  7.0  6.8   Total Bilirubin 0.2 - 1.2 mg/dL 0.4  0.5  0.3   Alkaline Phos 44 - 121 IU/L   71   AST 10 - 35 U/L 19  18  23    ALT 9 - 46 U/L 20  19  23      Lipid Panel     Component Value Date/Time   CHOL 204 (H) 07/22/2022 1117   CHOL 191 12/23/2021 1136   TRIG 148 07/22/2022 1117   HDL  37 (L)  07/22/2022 1117   HDL 31 (L) 12/23/2021 1136   CHOLHDL 5.5 (H) 07/22/2022 1117   VLDL 36 (H) 11/30/2016 0914   LDLCALC 140 (H) 07/22/2022 1117    CBC    Component Value Date/Time   WBC 7.7 01/21/2023 1419   RBC 4.37 01/21/2023 1419   HGB 14.2 01/21/2023 1419   HGB 15.0 12/23/2021 1136   HCT 41.7 01/21/2023 1419   HCT 42.8 12/23/2021 1136   PLT 184 01/21/2023 1419   PLT 212 12/23/2021 1136   MCV 95.4 01/21/2023 1419   MCV 93 12/23/2021 1136   MCH 32.5 01/21/2023 1419   MCHC 34.1 01/21/2023 1419   RDW 12.7 01/21/2023 1419   RDW 12.9 12/23/2021 1136   LYMPHSABS 3,296 01/21/2023 1419   LYMPHSABS 3.6 (H) 12/23/2021 1136   MONOABS 0.7 11/14/2013 1639   EOSABS 354 01/21/2023 1419   EOSABS 0.4 12/23/2021 1136   BASOSABS 69 01/21/2023 1419   BASOSABS 0.1 12/23/2021 1136    No results found for: "HGBA1C"  The 10-year ASCVD risk score (Arnett DK, et al., 2019) is: 11.7%   Values used to calculate the score:     Age: 32 years     Sex: Male     Is Non-Hispanic African American: No     Diabetic: No     Tobacco smoker: Yes     Systolic Blood Pressure: 107 mmHg     Is BP treated: No     HDL Cholesterol: 37 mg/dL     Total Cholesterol: 204 mg/dL  Assessment & Plan:      Chronic Obstructive Pulmonary Disease (COPD)   Patient continues to smoke approximately one pack per day. Currently using Trelegy and Albuterol inhalers, but unsure of his effectiveness. Patient is reluctant to use smoking cessation aids.   -Continue Trelegy and Albuterol inhalers.   -Encourage smoking cessation at each visit.    Lung Cancer Screening   Patient is a long-term smoker, making him high risk for lung cancer. A CT scan was ordered in July of the previous year for screening, but patient has not completed it and is reluctant to do so.   -Respect patient's decision to delay CT scan for another year.    Hyperlipidemia /previous cardiovascular risk Last cholesterol check was in February of the  previous year and was elevated. Patient is reluctant to have blood work done.   -10-year ASCVD risk of 11.7% -Attempt to check cholesterol today.  HIV  He is currently under the care of an infectious disease specialist and is compliant with his antiretroviral medications.   -Continue current infectious disease management.            Meds ordered this encounter  Medications   albuterol (VENTOLIN HFA) 108 (90 Base) MCG/ACT inhaler    Sig: Inhale 2 puffs into the lungs every 6 (six) hours as needed for wheezing or shortness of breath.    Dispense:  1 each    Refill:  11   Fluticasone-Umeclidin-Vilant (TRELEGY ELLIPTA) 100-62.5-25 MCG/ACT AEPB    Sig: Inhale 1 puff into the lungs daily.    Dispense:  1 each    Refill:  11    Discontinue Anoro    Follow-up: Return in about 6 years (around 06/29/2029) for Chronic medical conditions.       Hoy Register, MD, FAAFP. [facility] [place], [place] [phone]   06/30/2023, 11:10 AM

## 2023-06-30 NOTE — Patient Instructions (Signed)
Please call to schedule your colonoscopy as the GI office has been unsuccessful in reaching you. Placed in [facility] [address] [place], [place] [address] PH# [phone]

## 2023-10-13 NOTE — Progress Notes (Signed)
 The 10-year ASCVD risk score (Arnett DK, et al., 2019) is: 11.7%   Values used to calculate the score:     Age: 58 years     Sex: Male     Is Non-Hispanic African American: No     Diabetic: No     Tobacco smoker: Yes     Systolic Blood Pressure: 107 mmHg     Is BP treated: No     HDL Cholesterol: 37 mg/dL     Total Cholesterol: 204 mg/dL  No current statin therapy. Next appointment note updated.   Jaman Aro, BSN, RN

## 2023-10-14 ENCOUNTER — Other Ambulatory Visit: Payer: Self-pay | Admitting: Family Medicine

## 2023-10-14 DIAGNOSIS — J438 Other emphysema: Secondary | ICD-10-CM

## 2023-12-29 ENCOUNTER — Ambulatory Visit: Payer: 59 | Admitting: Internal Medicine

## 2023-12-29 ENCOUNTER — Encounter: Payer: Self-pay | Admitting: Internal Medicine

## 2023-12-29 ENCOUNTER — Other Ambulatory Visit: Payer: Self-pay

## 2023-12-29 ENCOUNTER — Ambulatory Visit: Admitting: Internal Medicine

## 2023-12-29 VITALS — BP 117/72 | HR 69 | Temp 97.7°F | Ht 70.0 in | Wt 185.0 lb

## 2023-12-29 DIAGNOSIS — B20 Human immunodeficiency virus [HIV] disease: Secondary | ICD-10-CM

## 2023-12-29 DIAGNOSIS — E785 Hyperlipidemia, unspecified: Secondary | ICD-10-CM | POA: Diagnosis not present

## 2023-12-29 DIAGNOSIS — Z8619 Personal history of other infectious and parasitic diseases: Secondary | ICD-10-CM

## 2023-12-29 DIAGNOSIS — E7849 Other hyperlipidemia: Secondary | ICD-10-CM

## 2023-12-29 DIAGNOSIS — Z79899 Other long term (current) drug therapy: Secondary | ICD-10-CM

## 2023-12-29 MED ORDER — BIKTARVY 50-200-25 MG PO TABS: 1.0000 | ORAL_TABLET | Freq: Every day | ORAL | 11 refills | Status: DC

## 2023-12-29 NOTE — Progress Notes (Signed)
 RFV: follow up for HIV disease  Patient ID: Garrett Knight, male   DOB: 28-Sep-1965, 58 y.o.   MRN: [id number]  HPI Garrett Knight is a 58yo M with HIV disease, currently on biktarvy , CD 4 count   Outpatient Encounter Medications as of 12/29/2023  Medication Sig   bictegravir-emtricitabine-tenofovir AF (BIKTARVY ) 50-200-25 MG TABS tablet Take 1 tablet by mouth daily.   Fluticasone-Umeclidin-Vilant (TRELEGY ELLIPTA ) 100-62.5-25 MCG/ACT AEPB Inhale 1 puff into the lungs daily.   VENTOLIN  HFA 108 (90 Base) MCG/ACT inhaler TAKE 2 PUFFS BY MOUTH EVERY 6 HOURS AS NEEDED FOR WHEEZE OR SHORTNESS OF BREATH   No facility-administered encounter medications on file as of 12/29/2023.     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  HENT: Negative for congestion, sore throat, rhinorrhea, sneezing, trouble swallowing and sinus pressure.  Eyes: Negative for photophobia and visual disturbance.  Respiratory: Negative for cough, chest tightness, shortness of breath, wheezing and stridor.  Cardiovascular: Negative for chest pain, palpitations and leg swelling.  Gastrointestinal: Negative for nausea, vomiting, abdominal pain, diarrhea, constipation, blood in stool, abdominal  distention and anal bleeding.  Genitourinary: Negative for dysuria, hematuria, flank pain and difficulty urinating.  Musculoskeletal: Negative for myalgias, back pain, joint swelling, arthralgias and gait problem.  Skin: Negative for color change, pallor, rash and wound.  Neurological: Negative for dizziness, tremors, weakness and light-headedness.  Hematological: Negative for adenopathy. Does not bruise/bleed easily.  Psychiatric/Behavioral: Negative for behavioral problems, confusion, sleep disturbance, dysphoric mood, decreased concentration and agitation.   Physical Exam   BP 117/72   Pulse 69   Temp 97.7 F (36.5 C) (Oral)   Ht 5' 10 (1.778 m)   Wt 185 lb (83.9 kg)   SpO2 96%   BMI 26.54 kg/m   Physical Exam  Constitutional: He is oriented to person, place, and time. He appears well-developed and well-nourished. No distress.  HENT:  Mouth/Throat: Oropharynx is clear and moist. No oropharyngeal exudate.  Cardiovascular: Normal rate, regular rhythm and normal heart sounds. Exam reveals no gallop and no friction rub.  No murmur heard.  Pulmonary/Chest: Effort normal and breath sounds normal. No respiratory distress. He has no wheezes.  Abdominal: Soft. Bowel sounds are normal. He exhibits no distension. There is no tenderness.  Lymphadenopathy:  He has no cervical adenopathy.  Neurological: He is alert and oriented to person, place, and time.  Skin: Skin is warm and dry. No rash noted. No erythema.  Psychiatric: He has a normal mood and affect. His behavior is normal.   Lab Results  Component Value Date   CD4TCELL 35 01/21/2023   Lab Results  Component Value Date   CD4TABS 1,051 01/21/2023   CD4TABS 1,129 07/22/2022   CD4TABS 839 04/17/2021   Lab  Results  Component Value Date   HIV1RNAQUANT Not Detected 01/21/2023   Lab Results  Component Value Date   HEPBSAB NEG 11/14/2013   Lab Results  Component Value Date   LABRPR REACTIVE (A) 07/22/2022    CBC Lab  Results  Component Value Date   WBC 7.7 01/21/2023   RBC 4.37 01/21/2023   HGB 14.2 01/21/2023   HCT 41.7 01/21/2023   PLT 184 01/21/2023   MCV 95.4 01/21/2023   MCH 32.5 01/21/2023   MCHC 34.1 01/21/2023   RDW 12.7 01/21/2023   LYMPHSABS 3,296 01/21/2023   MONOABS 0.7 11/14/2013   EOSABS 354 01/21/2023    BMET Lab Results  Component Value Date   NA 140 01/21/2023   K 3.9 01/21/2023   CL 107 01/21/2023   CO2 27 01/21/2023   GLUCOSE 137 01/21/2023   BUN 14 01/21/2023   CREATININE 1.19 01/21/2023   CALCIUM 9.1 01/21/2023   GFRNONAA 67 05/31/2017   GFRAA 78 05/31/2017      Assessment and Plan  HIV disease= will check CD 4 count and HIV VL. Plan to get Biktarvy  refill  Long term medication management = will check cr  Health maintenance/hx of syphilis = will check cr  Hyperlipidemia = plan to check lipid profile  Hx of emphysema = will order chest CT pulm ca screening next visit

## 2023-12-31 LAB — T-HELPER CELL (CD4) - (RCID CLINIC ONLY)
CD4 % Helper T Cell: 33 % (ref 33–65)
CD4 T Cell Abs: 1033 /uL (ref 400–1790)

## 2024-01-01 LAB — COMPLETE METABOLIC PANEL WITHOUT GFR
AG Ratio: 1.5 (calc) (ref 1.0–2.5)
ALT: 23 U/L (ref 9–46)
AST: 19 U/L (ref 10–35)
Albumin: 4 g/dL (ref 3.6–5.1)
Alkaline phosphatase (APISO): 69 U/L (ref 35–144)
BUN/Creatinine Ratio: 8 (calc) (ref 6–22)
BUN: 11 mg/dL (ref 7–25)
CO2: 27 mmol/L (ref 20–32)
Calcium: 9.4 mg/dL (ref 8.6–10.3)
Chloride: 107 mmol/L (ref 98–110)
Creat: 1.31 mg/dL — ABNORMAL HIGH (ref 0.70–1.30)
Globulin: 2.7 g/dL (ref 1.9–3.7)
Glucose, Bld: 132 mg/dL — ABNORMAL HIGH (ref 65–99)
Potassium: 3.7 mmol/L (ref 3.5–5.3)
Sodium: 140 mmol/L (ref 135–146)
Total Bilirubin: 0.5 mg/dL (ref 0.2–1.2)
Total Protein: 6.7 g/dL (ref 6.1–8.1)

## 2024-01-01 LAB — LIPID PANEL
Cholesterol: 196 mg/dL (ref <200)
HDL: 35 mg/dL — ABNORMAL LOW (ref >=40)
LDL Cholesterol (Calc): 136 mg/dL — ABNORMAL HIGH
Non-HDL Cholesterol (Calc): 161 mg/dL — ABNORMAL HIGH (ref <130)
Total CHOL/HDL Ratio: 5.6 (calc) — ABNORMAL HIGH (ref <5.0)
Triglycerides: 130 mg/dL (ref <150)

## 2024-01-01 LAB — HIV-1 RNA QUANT-NO REFLEX-BLD
HIV 1 RNA Quant: NOT DETECTED {copies}/mL
HIV-1 RNA Quant, Log: NOT DETECTED {Log_copies}/mL

## 2024-01-01 LAB — CBC WITH DIFFERENTIAL/PLATELET
Absolute Lymphocytes: 3046 {cells}/uL (ref 850–3900)
Absolute Monocytes: 497 {cells}/uL (ref 200–950)
Basophils Absolute: 57 {cells}/uL (ref 0–200)
Basophils Relative: 0.8 %
Eosinophils Absolute: 320 {cells}/uL (ref 15–500)
Eosinophils Relative: 4.5 %
HCT: 43.1 % (ref 38.5–50.0)
Hemoglobin: 14.8 g/dL (ref 13.2–17.1)
MCH: 32.3 pg (ref 27.0–33.0)
MCHC: 34.3 g/dL (ref 32.0–36.0)
MCV: 94.1 fL (ref 80.0–100.0)
MPV: 9.5 fL (ref 7.5–12.5)
Monocytes Relative: 7 %
Neutro Abs: 3181 {cells}/uL (ref 1500–7800)
Neutrophils Relative %: 44.8 %
Platelets: 174 Thousand/uL (ref 140–400)
RBC: 4.58 Million/uL (ref 4.20–5.80)
RDW: 13 % (ref 11.0–15.0)
Total Lymphocyte: 42.9 %
WBC: 7.1 Thousand/uL (ref 3.8–10.8)

## 2024-01-01 LAB — RPR TITER: RPR Titer: 1:1 {titer} — ABNORMAL HIGH

## 2024-01-01 LAB — T PALLIDUM AB: T Pallidum Abs: POSITIVE — AB

## 2024-01-01 LAB — RPR: RPR Ser Ql: REACTIVE — AB

## 2024-01-24 ENCOUNTER — Other Ambulatory Visit (HOSPITAL_COMMUNITY): Payer: Self-pay

## 2024-01-27 ENCOUNTER — Other Ambulatory Visit: Payer: Self-pay | Admitting: Pharmacist

## 2024-01-27 DIAGNOSIS — B20 Human immunodeficiency virus [HIV] disease: Secondary | ICD-10-CM

## 2024-01-27 MED ORDER — BICTEGRAVIR-EMTRICITAB-TENOFOV 50-200-25 MG PO TABS: 1.0000 | ORAL_TABLET | Freq: Every day | ORAL | Status: AC

## 2024-01-27 NOTE — Progress Notes (Signed)
 Medication Samples have been provided to the patient.  Drug name: Biktarvy         Strength: 50/200/25 mg       Qty: 14 tablets (2 bottles) LOT: [id number]   Exp.Date: 7/27  Samples requested by Arland Hutchinson.  Dosing instructions: Take one tablet by mouth once daily  The patient has been instructed regarding the correct time, dose, and frequency of taking this medication, including desired effects and most common side effects.   Alan Geralds, PharmD, CPP, BCIDP, AAHIVP Clinical Pharmacist Practitioner Infectious Diseases Clinical Pharmacist [facility]

## 2024-03-13 ENCOUNTER — Other Ambulatory Visit: Payer: Self-pay

## 2024-03-13 DIAGNOSIS — B20 Human immunodeficiency virus [HIV] disease: Secondary | ICD-10-CM

## 2024-03-13 MED ORDER — BIKTARVY 50-200-25 MG PO TABS: 1.0000 | ORAL_TABLET | Freq: Every day | ORAL | 5 refills | Status: AC

## 2024-03-16 ENCOUNTER — Other Ambulatory Visit: Payer: Self-pay | Admitting: Pharmacist

## 2024-03-16 MED ORDER — BICTEGRAVIR-EMTRICITAB-TENOFOV 50-200-25 MG PO TABS: 1.0000 | ORAL_TABLET | Freq: Every day | ORAL | Status: AC

## 2024-03-16 NOTE — Progress Notes (Signed)
 Medication Samples have been provided to the patient.  Drug name: Biktarvy         Strength: 50/200/25 mg       Qty: 7 tablets (1 bottles) LOT: [id number]   Exp.Date: 12/28  Samples requested by Charmaine Sharps.  Dosing instructions: Take one tablet by mouth once daily  The patient has been instructed regarding the correct time, dose, and frequency of taking this medication, including desired effects and most common side effects.   Alan Geralds, PharmD, CPP, BCIDP, AAHIVP Clinical Pharmacist Practitioner Infectious Diseases Clinical Pharmacist [facility]

## 2024-04-24 ENCOUNTER — Other Ambulatory Visit: Payer: Self-pay

## 2024-04-24 NOTE — Progress Notes (Signed)
 ERROR

## 2024-11-27 ENCOUNTER — Ambulatory Visit: Admitting: Internal Medicine
